# Patient Record
Sex: Male | Born: 1960 | Race: Black or African American | Hispanic: No | Marital: Married | Smoking: Never smoker
Health system: Southern US, Community
[De-identification: ages and names within clinical notes are randomized; demographics above are authoritative.]

## PROBLEM LIST (undated history)

## (undated) DIAGNOSIS — I10 Essential (primary) hypertension: Secondary | ICD-10-CM

## (undated) DIAGNOSIS — N289 Disorder of kidney and ureter, unspecified: Secondary | ICD-10-CM

## (undated) DIAGNOSIS — N200 Calculus of kidney: Secondary | ICD-10-CM

## (undated) DIAGNOSIS — J45909 Unspecified asthma, uncomplicated: Secondary | ICD-10-CM

## (undated) DIAGNOSIS — E7800 Pure hypercholesterolemia, unspecified: Secondary | ICD-10-CM

## (undated) DIAGNOSIS — T7840XA Allergy, unspecified, initial encounter: Secondary | ICD-10-CM

## (undated) HISTORY — PX: SHOULDER ARTHROSCOPY WITH ROTATOR CUFF REPAIR: SHX5685

## (undated) HISTORY — PX: VASECTOMY: SHX75

## (undated) HISTORY — PX: KNEE ARTHROSCOPY: SUR90

---

## 2000-07-01 HISTORY — PX: VASECTOMY: SHX75

## 2005-07-25 ENCOUNTER — Ambulatory Visit (HOSPITAL_BASED_OUTPATIENT_CLINIC_OR_DEPARTMENT_OTHER): Admission: RE | Admit: 2005-07-25 | Discharge: 2005-07-25 | Payer: Self-pay | Admitting: Orthopedic Surgery

## 2013-01-23 ENCOUNTER — Emergency Department (HOSPITAL_BASED_OUTPATIENT_CLINIC_OR_DEPARTMENT_OTHER): Payer: BC Managed Care – PPO

## 2013-01-23 ENCOUNTER — Emergency Department (HOSPITAL_BASED_OUTPATIENT_CLINIC_OR_DEPARTMENT_OTHER)
Admission: EM | Admit: 2013-01-23 | Discharge: 2013-01-23 | Disposition: A | Discharge status: Home / Self Care | Payer: BC Managed Care – PPO | Attending: Emergency Medicine | Admitting: Emergency Medicine

## 2013-01-23 ENCOUNTER — Encounter (HOSPITAL_BASED_OUTPATIENT_CLINIC_OR_DEPARTMENT_OTHER): Payer: Self-pay | Admitting: *Deleted

## 2013-01-23 DIAGNOSIS — N200 Calculus of kidney: Secondary | ICD-10-CM

## 2013-01-23 DIAGNOSIS — R110 Nausea: Secondary | ICD-10-CM | POA: Insufficient documentation

## 2013-01-23 HISTORY — DX: Disorder of kidney and ureter, unspecified: N28.9

## 2013-01-23 LAB — URINALYSIS, ROUTINE W REFLEX MICROSCOPIC
Leukocytes, UA: NEGATIVE
Protein, ur: NEGATIVE mg/dL
Specific Gravity, Urine: 1.019 (ref 1.005–1.030)
Urobilinogen, UA: 1 mg/dL (ref 0.0–1.0)

## 2013-01-23 LAB — URINE MICROSCOPIC-ADD ON

## 2013-01-23 MED ORDER — OXYCODONE-ACETAMINOPHEN 5-325 MG PO TABS: 2.0000 | ORAL_TABLET | ORAL | Status: DC | PRN

## 2013-01-23 MED ORDER — SODIUM CHLORIDE 0.9 % IV BOLUS (SEPSIS)
1000.0000 mL | Freq: Once | INTRAVENOUS | Status: AC
  Administered 2013-01-23: 1000 mL via INTRAVENOUS

## 2013-01-23 MED ORDER — KETOROLAC TROMETHAMINE 30 MG/ML IJ SOLN
30.0000 mg | Freq: Once | INTRAMUSCULAR | Status: AC
  Administered 2013-01-23: 30 mg via INTRAVENOUS
  Filled 2013-01-23: qty 1

## 2013-01-23 MED ORDER — ONDANSETRON HCL 4 MG/2ML IJ SOLN
4.0000 mg | Freq: Once | INTRAMUSCULAR | Status: AC
  Administered 2013-01-23: 4 mg via INTRAVENOUS
  Filled 2013-01-23: qty 2

## 2013-01-23 NOTE — ED Provider Notes (Signed)
  CSN: [id number]     Arrival date & time 01/23/13  0158 History     First MD Initiated Contact with Patient 01/23/13 0209     Chief Complaint  Patient presents with  . Flank Pain   (Consider location/radiation/quality/duration/timing/severity/associated sxs/prior Treatment) HPI Comments: Patient started about 2 hours ago with the sudden onset of pain in the right flank radiating to the right lower quadrant.  He is having nausea, but denies urinary complaints.  No fevers or chills.  He reports having had a kidney stone 20 years ago but no problems since.    Patient is a 52 y.o. male presenting with flank pain. The history is provided by the patient.  Flank Pain This is a new problem. The current episode started 1 to 2 hours ago. The problem occurs constantly. The problem has not changed since onset.Associated symptoms include abdominal pain. Nothing aggravates the symptoms. Nothing relieves the symptoms. He has tried nothing for the symptoms. The treatment provided no relief.    History reviewed. No pertinent past medical history. History reviewed. No pertinent past surgical history. History reviewed. No pertinent family history. History  Substance Use Topics  . Smoking status: Never Smoker   . Smokeless tobacco: Never Used  . Alcohol Use: No    Review of Systems  Gastrointestinal: Positive for abdominal pain.  Genitourinary: Positive for flank pain.  All other systems reviewed and are negative.    Allergies  Iodine  Home Medications  No current outpatient prescriptions on file. BP 140/79  Pulse 59  Temp(Src) 98.1 F (36.7 C) (Oral)  Resp 18  Ht 5\' 11"  (1.803 m)  Wt 200 lb (90.719 kg)  BMI 27.91 kg/m2  SpO2 98% Physical Exam  Nursing note and vitals reviewed. Constitutional: He is oriented to person, place, and time. He appears well-developed and well-nourished. No distress.  HENT:  Head: Normocephalic and atraumatic.  Mouth/Throat: Oropharynx is clear and moist.   Neck: Normal range of motion. Neck supple.  Cardiovascular: Normal rate, regular rhythm and normal heart sounds.   Pulmonary/Chest: Effort normal and breath sounds normal.  Abdominal: Soft. Bowel sounds are normal. He exhibits no distension. There is no tenderness.  There is mild ttp in the right flank and right lower quadrant.  No rebound or guarding.  Musculoskeletal: Normal range of motion. He exhibits no edema.  Lymphadenopathy:    He has no cervical adenopathy.  Neurological: He is alert and oriented to person, place, and time.  Skin: Skin is warm and dry. He is not diaphoretic.    ED Course   Procedures (including critical care time)  Labs Reviewed - No data to display No results found. No diagnosis found.  MDM  The ct shows a 3 mm mid ureteral stone.  He is feeling better with meds given.  Will discharge with pain meds, follow up with urology if not improving in the next 2-3 days.  Return prn.  Geoffery Lyons, MD 01/23/13 [phone]

## 2013-01-23 NOTE — ED Notes (Signed)
Pt states that he began having right flank pain this am around 12:30 as well as nausea

## 2013-03-03 ENCOUNTER — Ambulatory Visit: Payer: BC Managed Care – PPO | Attending: Orthopedic Surgery | Admitting: Rehabilitation

## 2013-03-03 DIAGNOSIS — M25569 Pain in unspecified knee: Secondary | ICD-10-CM | POA: Insufficient documentation

## 2013-03-03 DIAGNOSIS — M25669 Stiffness of unspecified knee, not elsewhere classified: Secondary | ICD-10-CM | POA: Insufficient documentation

## 2013-03-03 DIAGNOSIS — IMO0001 Reserved for inherently not codable concepts without codable children: Secondary | ICD-10-CM | POA: Insufficient documentation

## 2013-03-03 DIAGNOSIS — R609 Edema, unspecified: Secondary | ICD-10-CM | POA: Insufficient documentation

## 2013-03-08 ENCOUNTER — Ambulatory Visit: Payer: BC Managed Care – PPO | Admitting: Rehabilitation

## 2013-03-11 ENCOUNTER — Ambulatory Visit: Payer: BC Managed Care – PPO | Admitting: Rehabilitation

## 2013-03-15 ENCOUNTER — Ambulatory Visit: Payer: BC Managed Care – PPO | Admitting: Rehabilitation

## 2013-03-17 ENCOUNTER — Ambulatory Visit: Payer: BC Managed Care – PPO | Admitting: Rehabilitation

## 2013-03-22 ENCOUNTER — Ambulatory Visit: Payer: BC Managed Care – PPO | Admitting: Rehabilitation

## 2013-03-25 ENCOUNTER — Ambulatory Visit: Payer: BC Managed Care – PPO | Admitting: Rehabilitation

## 2013-03-29 ENCOUNTER — Ambulatory Visit: Payer: BC Managed Care – PPO | Admitting: Rehabilitation

## 2013-04-01 ENCOUNTER — Ambulatory Visit: Payer: BC Managed Care – PPO | Attending: Orthopedic Surgery | Admitting: Rehabilitation

## 2013-04-01 DIAGNOSIS — M25669 Stiffness of unspecified knee, not elsewhere classified: Secondary | ICD-10-CM | POA: Insufficient documentation

## 2013-04-01 DIAGNOSIS — R609 Edema, unspecified: Secondary | ICD-10-CM | POA: Insufficient documentation

## 2013-04-01 DIAGNOSIS — M25569 Pain in unspecified knee: Secondary | ICD-10-CM | POA: Insufficient documentation

## 2013-04-01 DIAGNOSIS — IMO0001 Reserved for inherently not codable concepts without codable children: Secondary | ICD-10-CM | POA: Insufficient documentation

## 2013-04-05 ENCOUNTER — Ambulatory Visit: Payer: BC Managed Care – PPO | Admitting: Rehabilitation

## 2013-04-08 ENCOUNTER — Ambulatory Visit: Payer: BC Managed Care – PPO | Admitting: Rehabilitation

## 2013-04-12 ENCOUNTER — Ambulatory Visit: Payer: BC Managed Care – PPO | Admitting: Rehabilitation

## 2013-04-15 ENCOUNTER — Ambulatory Visit: Payer: BC Managed Care – PPO | Admitting: Rehabilitation

## 2013-04-19 ENCOUNTER — Ambulatory Visit: Payer: BC Managed Care – PPO | Admitting: Rehabilitation

## 2013-04-22 ENCOUNTER — Ambulatory Visit: Payer: BC Managed Care – PPO | Admitting: Rehabilitation

## 2013-04-26 ENCOUNTER — Ambulatory Visit: Payer: BC Managed Care – PPO | Admitting: Rehabilitation

## 2013-04-29 ENCOUNTER — Ambulatory Visit: Payer: BC Managed Care – PPO | Admitting: Rehabilitation

## 2013-05-06 ENCOUNTER — Encounter: Payer: BC Managed Care – PPO | Admitting: Rehabilitation

## 2013-09-03 ENCOUNTER — Encounter (HOSPITAL_COMMUNITY): Payer: Self-pay | Admitting: *Deleted

## 2013-09-03 ENCOUNTER — Emergency Department (HOSPITAL_BASED_OUTPATIENT_CLINIC_OR_DEPARTMENT_OTHER): Payer: BC Managed Care – PPO

## 2013-09-03 ENCOUNTER — Encounter (HOSPITAL_BASED_OUTPATIENT_CLINIC_OR_DEPARTMENT_OTHER): Payer: Self-pay | Admitting: Emergency Medicine

## 2013-09-03 ENCOUNTER — Other Ambulatory Visit: Payer: Self-pay | Admitting: Urology

## 2013-09-03 ENCOUNTER — Emergency Department (HOSPITAL_BASED_OUTPATIENT_CLINIC_OR_DEPARTMENT_OTHER)
Admission: EM | Admit: 2013-09-03 | Discharge: 2013-09-03 | Disposition: A | Discharge status: Home / Self Care | Payer: BC Managed Care – PPO | Attending: Emergency Medicine | Admitting: Emergency Medicine

## 2013-09-03 DIAGNOSIS — Z79899 Other long term (current) drug therapy: Secondary | ICD-10-CM | POA: Insufficient documentation

## 2013-09-03 DIAGNOSIS — N200 Calculus of kidney: Secondary | ICD-10-CM | POA: Insufficient documentation

## 2013-09-03 HISTORY — DX: Calculus of kidney: N20.0

## 2013-09-03 LAB — CBC WITH DIFFERENTIAL/PLATELET
Basophils Absolute: 0 10*3/uL (ref 0.0–0.1)
Basophils Relative: 0 % (ref 0–1)
Eosinophils Absolute: 0.1 10*3/uL (ref 0.0–0.7)
Eosinophils Relative: 1 % (ref 0–5)
HCT: 40.4 % (ref 39.0–52.0)
HEMOGLOBIN: 14.1 g/dL (ref 13.0–17.0)
LYMPHS ABS: 1 10*3/uL (ref 0.7–4.0)
Lymphocytes Relative: 13 % (ref 12–46)
MCH: 30.5 pg (ref 26.0–34.0)
MCHC: 34.9 g/dL (ref 30.0–36.0)
MCV: 87.3 fL (ref 78.0–100.0)
MONOS PCT: 7 % (ref 3–12)
Monocytes Absolute: 0.5 10*3/uL (ref 0.1–1.0)
NEUTROS ABS: 6.5 10*3/uL (ref 1.7–7.7)
NEUTROS PCT: 80 % — AB (ref 43–77)
Platelets: 206 10*3/uL (ref 150–400)
RBC: 4.63 MIL/uL (ref 4.22–5.81)
RDW: 13 % (ref 11.5–15.5)
WBC: 8.1 10*3/uL (ref 4.0–10.5)

## 2013-09-03 LAB — BASIC METABOLIC PANEL
BUN: 17 mg/dL (ref 6–23)
CHLORIDE: 103 meq/L (ref 96–112)
CO2: 26 mEq/L (ref 19–32)
Calcium: 9.8 mg/dL (ref 8.4–10.5)
Creatinine, Ser: 1.4 mg/dL — ABNORMAL HIGH (ref 0.50–1.35)
GFR calc non Af Amer: 57 mL/min — ABNORMAL LOW (ref >90)
GFR, EST AFRICAN AMERICAN: 66 mL/min — AB (ref >90)
GLUCOSE: 139 mg/dL — AB (ref 70–99)
POTASSIUM: 4.1 meq/L (ref 3.7–5.3)
Sodium: 140 mEq/L (ref 137–147)

## 2013-09-03 LAB — URINALYSIS, ROUTINE W REFLEX MICROSCOPIC
Bilirubin Urine: NEGATIVE
Glucose, UA: NEGATIVE mg/dL
Hgb urine dipstick: NEGATIVE
Ketones, ur: NEGATIVE mg/dL
LEUKOCYTES UA: NEGATIVE
NITRITE: NEGATIVE
PH: 7.5 (ref 5.0–8.0)
Protein, ur: NEGATIVE mg/dL
SPECIFIC GRAVITY, URINE: 1.012 (ref 1.005–1.030)
Urobilinogen, UA: 0.2 mg/dL (ref 0.0–1.0)

## 2013-09-03 MED ORDER — MORPHINE SULFATE 4 MG/ML IJ SOLN
INTRAMUSCULAR | Status: AC
  Filled 2013-09-03: qty 1

## 2013-09-03 MED ORDER — MORPHINE SULFATE 4 MG/ML IJ SOLN
4.0000 mg | Freq: Once | INTRAMUSCULAR | Status: AC
  Administered 2013-09-03: 4 mg via INTRAVENOUS

## 2013-09-03 MED ORDER — OXYCODONE-ACETAMINOPHEN 5-325 MG PO TABS
1.0000 | ORAL_TABLET | Freq: Four times a day (QID) | ORAL | Status: DC | PRN
Start: 2013-09-03 — End: 2022-02-04

## 2013-09-03 MED ORDER — TAMSULOSIN HCL 0.4 MG PO CAPS: 0.4000 mg | ORAL_CAPSULE | Freq: Every day | ORAL | Status: DC

## 2013-09-03 MED ORDER — TAMSULOSIN HCL 0.4 MG PO CAPS
0.4000 mg | ORAL_CAPSULE | Freq: Once | ORAL | Status: AC
  Administered 2013-09-03: 0.4 mg via ORAL
  Filled 2013-09-03: qty 1

## 2013-09-03 MED ORDER — OXYCODONE-ACETAMINOPHEN 5-325 MG PO TABS
1.0000 | ORAL_TABLET | Freq: Once | ORAL | Status: AC
  Administered 2013-09-03: 1 via ORAL
  Filled 2013-09-03: qty 1

## 2013-09-03 MED ORDER — KETOROLAC TROMETHAMINE 15 MG/ML IJ SOLN
INTRAMUSCULAR | Status: AC
  Administered 2013-09-03: 30 mg
  Filled 2013-09-03: qty 2

## 2013-09-03 MED ORDER — ONDANSETRON 8 MG PO TBDP: ORAL_TABLET | ORAL | Status: DC

## 2013-09-03 MED ORDER — KETOROLAC TROMETHAMINE 30 MG/ML IJ SOLN: 30.0000 mg | Freq: Once | INTRAMUSCULAR | Status: DC

## 2013-09-03 NOTE — ED Notes (Signed)
Pt reports left sided flank pain with acute onset, denies difficulty voiding

## 2013-09-03 NOTE — ED Provider Notes (Signed)
CSN: [id number]     Arrival date & time 09/03/13  0046 History   First MD Initiated Contact with Patient 09/03/13 0056     Chief Complaint  Patient presents with  . Flank Pain     (Consider location/radiation/quality/duration/timing/severity/associated sxs/prior Treatment) Patient is a 53 y.o. male presenting with flank pain. The history is provided by the patient.  Flank Pain This is a new problem. The current episode started 6 to 12 hours ago. The problem occurs constantly. The problem has not changed since onset.Pertinent negatives include no chest pain, no abdominal pain, no headaches and no shortness of breath. Nothing aggravates the symptoms. Nothing relieves the symptoms. He has tried acetaminophen for the symptoms. The treatment provided mild relief.    Past Medical History  Diagnosis Date  . Renal disorder     kidney stone  . Kidney stone    Past Surgical History  Procedure Laterality Date  . Vasectomy    . Knee arthroscopy     History reviewed. No pertinent family history. History  Substance Use Topics  . Smoking status: Never Smoker   . Smokeless tobacco: Never Used  . Alcohol Use: No    Review of Systems  Respiratory: Negative for shortness of breath.   Cardiovascular: Negative for chest pain.  Gastrointestinal: Negative for vomiting, abdominal pain, diarrhea and constipation.  Genitourinary: Positive for flank pain. Negative for dysuria, hematuria and difficulty urinating.  Neurological: Negative for headaches.  All other systems reviewed and are negative.      Allergies  Iodine and Shellfish allergy  Home Medications   Current Outpatient Rx  Name  Route  Sig  Dispense  Refill  . allopurinol (ZYLOPRIM) 100 MG tablet   Oral   Take 100 mg by mouth daily.         . NON FORMULARY      azor         . olmesartan (BENICAR) 40 MG tablet   Oral   Take 40 mg by mouth daily.         Marland Kitchen oxyCODONE-acetaminophen (PERCOCET) 5-325 MG per tablet  Oral   Take 2 tablets by mouth every 4 (four) hours as needed for pain.   25 tablet   0   . UNKNOWN TO PATIENT      cholesterol          BP 155/93  Pulse 80  Temp(Src) 98.7 F (37.1 C) (Oral)  Resp 18  Ht 5\' 11"  (1.803 m)  Wt 205 lb (92.987 kg)  BMI 28.60 kg/m2  SpO2 97% Physical Exam  Constitutional: He is oriented to person, place, and time. He appears well-nourished. No distress.  HENT:  Head: Normocephalic and atraumatic.  Mouth/Throat: Oropharynx is clear and moist.  Eyes: Conjunctivae are normal. Pupils are equal, round, and reactive to light.  Neck: Normal range of motion. Neck supple.  Cardiovascular: Normal rate, regular rhythm and intact distal pulses.   Pulmonary/Chest: Effort normal and breath sounds normal. He has no wheezes. He has no rales.  Abdominal: Soft. Bowel sounds are normal. There is no tenderness. There is no rebound and no guarding.  Musculoskeletal: Normal range of motion.  Neurological: He is alert and oriented to person, place, and time.  Skin: Skin is warm and dry.  Psychiatric: He has a normal mood and affect.    ED Course  Procedures (including critical care time) Labs Review Labs Reviewed  CBC WITH DIFFERENTIAL - Abnormal; Notable for the following:    Neutrophils  Relative % 80 (*)    All other components within normal limits  BASIC METABOLIC PANEL - Abnormal; Notable for the following:    Glucose, Bld 139 (*)    Creatinine, Ser 1.40 (*)    GFR calc non Af Amer 57 (*)    GFR calc Af Amer 66 (*)    All other components within normal limits  URINALYSIS, ROUTINE W REFLEX MICROSCOPIC  URINALYSIS, ROUTINE W REFLEX MICROSCOPIC   Imaging Review No results found.   EKG Interpretation None      MDM   Final diagnoses:  None    Kidney stone, pain medication, antiemetics and follow up with urology for ongoing care   Virga Haltiwanger K Yicel Shannon-Rasch, MD 09/03/13 [phone]

## 2013-09-03 NOTE — ED Notes (Signed)
Left flank pain onset Thursday am, off and on  Worse last pm, denies n/v  No diff w urination

## 2013-09-03 NOTE — Discharge Instructions (Signed)
Diet for Kidney Stones  Kidney stones are small, hard masses that form inside your kidneys. They are made up of salts and minerals and often form when high levels build up in the urine. The minerals can then start to build up, crystalize, and stick together to form stones. There are several different types of kidney stones. The following types of stones may be influenced by dietary factors:   · Calcium Oxalate Stones. An oxalate is a salt found in certain foods. Within the body, calcium can combine with oxalates to form calcium oxalate stones, which can be excreted in the urine in high amounts. This is the most common type of kidney stone.  · Calcium Phosphate Stones. These stones may occur when the pH of the urine becomes too high, or less acidic, from too much calcium being excreted in the urine. The pH is a measure of how acidic or basic a substance is.  · Uric Acid Stones. This type of stone occurs when the pH of the urine becomes too low, or very acidic, because substances called purines build up in the urine. Purines are found in animal proteins. When the urine is highly concentrated with acid, uric acid kidney stones can form.    Other risk factors for kidney stones include genetics, environment, and being overweight. Your caregiver may ask you to follow specific diet guidelines based on the type of stone you have to lessen the chances of your body making more kidney stones.   GENERAL GUIDELINES FOR ALL TYPES OF STONES  · Drink plenty of fluid. Drink 12 16 cups of fluid a day, drinking mainly water. This is the most important thing you can do to prevent the formation of future kidney stones.  · Maintain a healthy weight. Your caregiver or dietitian can help you determine what a healthy weight is for you. If you are overweight, weight loss may help prevent the formation of future kidney stones.  · Eat a diet adequate in animal protein. Too much animal protein can contribute to the formation of stones. Your  dietitian can help you determine how much protein you should be eating. Avoid low carbohydrate, high protein diets.  · Follow a balanced eating approach. The DASH diet, which stands for "Dietary Approaches to Stop Hypertension," is an effective meal plan for reducing stone formation. This diet is high in fruits, vegetables, dairy, and whole grains and low in animal protein. Ask your caregiver or dietitian for information about the DASH diet.  ADDITIONAL DIET GUIDELINES FOR CALCIUM STONES  Avoid foods high in salt. This includes table salt, salt seasonings, MSG, soy sauce, cured and processed meats, salted crackers and snack foods, fast food, and canned soups and foods. Ask your caregiver or dietitian for information about reducing sodium in your diet or following the low sodium diet.   Ensure adequate calcium intake. Use the following table for calcium guidelines:  · Men 65 years old and younger  1000 mg/day.  · Men 65 years old and older  1500 mg/day.  · Women 25 50 years old  1000 mg/day.  · Women 50 years and older  1500 mg/day.  Your dietitian can help you determine if you are getting enough calcium in your diet. Foods that are high in calcium include dairy products, broccoli, cheese, yogurt, and pudding. If you need to take a calcium supplement, take it only in the form of calcium citrate.   Avoid foods high in oxalate. Be sure that any supplements you take do not   contain more than 500 mg of vitamin C. Vitamin C is converted into oxalate in the body. You do not need to avoid fruits and vegetables high in vitamin C.   · Grains: High-fiber or bran cereal, whole-wheat bread, grits, barley, buckwheat, amaranth, pretzels, and fruitcake.  · Vegetables: Dried beans, wax beans, dark leafy greens, eggplant, leeks, okra, parsley, rutabaga, tomato paste, watercress, zucchini, and escarole.  · Fruit: Dried apricots, red currants, figs, kiwi, and rhubarb.  · Meat and Meat Substitutes: Soybeans and foods made from soy  (soyburger, miso), dried beans, peanut butter.  · Milk: Chocolate milk mixes and soymilk.  · Fats and Oils: Nuts (peanuts, almonds, pecans, cashews, hazelnuts) and nut butters, sesame seeds, and tDahini paste.  · Condiments/Miscellaneous: Chocolate, carob, marmalade, poppy seeds, instant iced tea, and juice from high-oxalate fruits.     Document Released: 10/12/2010 Document Revised: 12/17/2011 Document Reviewed: 12/02/2011  ExitCare® Patient Information ©2014 [employer].

## 2013-09-05 NOTE — H&P (Signed)
History of Present Illness          F/u several issues. His PCP is Dr. Carolyne Fiscal      1-impotence - He sees a urologist in [facility] for impotence. He had T checked and it was "low normal". He tried Cialis on demand.   His is a [employer] carrier.   -Oct 2014 T 336    2- BPH - normal DRE 01-25-2013. On surveillance.   -Oct 2014 PSA 0.6    3-nephrolithiasis -   -07-28-14right flank pain - CT A/P revealed a 3 mm right prox ureteral stone, a 2 mm right renal stone and a 4mm left renal stone, enlarged prostate. He has passed stones  -07-28-14KUB -- calcification in the right pelvis consistent with a stone. The right kidney is difficult to visualize because of the overlying bowel. The left kidney shows a 4 mm left lower pole stone. The bowel gas pattern and bones appear normal.   Tried tamsulosin  -Sept 2014 passed stone -> CaOX        Mar 2015 interval hx    Patient returns due to a left ureteral stone. He developed acute onset left flank pain yesterday which was severe by the evening. He went to the ER where a CT scan of the abdomen and pelvis was obtained. This showed a 5-6 mm left proximal ureteral stone with mild hydronephrosis. There were no other stones in the left kidney. One small stone remains in the right kidney. The stone was visible on the scout. I reviewed all the images. His urine was clear. White count 8, BUN 17, creatinine 1.4    Today his pain is controlled. He has been staying hydrated. He has not passed the stone.     Past Medical History Problems  1. History of hypercholesterolemia (V12.29) 2. History of hypertension (V12.59) 3. History of kidney stones (V13.01)  Surgical History Problems  1. History of Knee Arthroscopy  Current Meds 1. Allopurinol TABS;  Therapy: (Recorded:28Jul2014) to Recorded 2. Atorvastatin Calcium TABS;  Therapy: (Recorded:28Jul2014) to Recorded 3. Azor 5-20 MG Oral Tablet;  Therapy: 26Jan2015 to Recorded 4.  Ondansetron 4 MG Oral Tablet Dispersible;  Therapy: (Recorded:06Mar2015) to Recorded 5. Oxycodone-Acetaminophen 5-325 MG Oral Tablet;  Therapy: 27Aug2014 to Recorded 6. Tamsulosin HCl - 0.4 MG Oral Capsule;  Therapy: (Recorded:06Mar2015) to Recorded  Allergies Medication  1. Iodine SOLN Non-Medication  2. Contrast Dye 3. Shellfish  Family History Problems  1. Family history of Acute Myocardial Infarction (V17.3) : Father 2. Family history of Death In The Family Father   MI age 10 3. Family history of Death In The Family Mother   brain aneurysm 4. Family history of Family Health Status Number Of Children   1 son    1 daughter 5. Family history of Lung Cancer (V16.1)  Social History Problems  1. Alcohol Use   rarely 2. Caffeine Use   maybe 1 a week 3. Marital History - Currently Married 4. Never A Smoker 5. Occupation:   [employer] 6. Denied: History of Tobacco Use (305.1)  Vitals Vital Signs [Data Includes: Last 1 Day]  Recorded: 06Mar2015 02:30PM  Blood Pressure: 132 / 75 Temperature: 97.1 F Heart Rate: 83  Physical Exam Constitutional: Well nourished and well developed . No acute distress.  Abdomen: No CVA tenderness.  Neuro/Psych:. Mood and affect are appropriate.    Results/Data Urine [Data Includes: Last 1 Day]   06Mar2015  COLOR YELLOW   APPEARANCE  CLEAR   SPECIFIC GRAVITY >1.030   pH 5.5   GLUCOSE NEG mg/dL  BILIRUBIN NEG   KETONE NEG mg/dL  BLOOD TRACE   PROTEIN NEG mg/dL  UROBILINOGEN 0.2 mg/dL  NITRITE NEG   LEUKOCYTE ESTERASE NEG   SQUAMOUS EPITHELIAL/HPF RARE   WBC 0-2 WBC/hpf  RBC 0-2 RBC/hpf  BACTERIA RARE   CRYSTALS NONE SEEN   CASTS NONE SEEN   Other MUCUS NOTED    Procedure KUB-comparison to CT from last night, findings: stone in left proximal ureter. No other stones. Normal bone and bowel gas pattern.     Assessment Assessed  1. Calculus of ureter (592.1)  Plan  Calculus of ureter  1. Follow-up Schedule Surgery  Office  Follow-up  Status: Complete  Done: 06Mar2015 Health Maintenance  2. UA With REFLEX; [Do Not Release]; Status:Complete;   Done: 06Mar2015 02:16PM  KUB; Status:Resulted - Requires Verification;  Done: 01Jan0001 12:00AM Due:08Mar2015; Marked Important;Ordered; Today;  ONG:[id number]:Calculus of ureter, Health Maintenance; Ordered KG:MWNUUVOZBy:Sona Nations;   Discussion/Summary I reviewed the CT and KUB images with the patient, we discussed the nature risk benefits of continued stone passage with off label use of tamsulosin, shockwave lithotripsy, ureteroscopy. We discussed shockwave lithotripsy is less invasive than ureteroscopy and typically with less side effects, but lower success rate. We discussed surveillance with a repeat KUB in 1-3 weeks. All questions answered and he elects to proceed with shockwave lithotripsy.     Signatures Electronically signed by : Jerilee FieldMatthew Trenten Watchman, M.D.; Sep 03 2013  4:42PM EST

## 2013-09-06 ENCOUNTER — Encounter (HOSPITAL_COMMUNITY)
Admission: RE | Disposition: A | Discharge status: Home / Self Care | Payer: Self-pay | Source: Ambulatory Visit | Attending: Urology

## 2013-09-06 ENCOUNTER — Ambulatory Visit (HOSPITAL_COMMUNITY): Payer: BC Managed Care – PPO

## 2013-09-06 ENCOUNTER — Ambulatory Visit (HOSPITAL_COMMUNITY)
Admission: RE | Admit: 2013-09-06 | Discharge: 2013-09-06 | Disposition: A | Discharge status: Home / Self Care | Payer: BC Managed Care – PPO | Source: Ambulatory Visit | Attending: Urology | Admitting: Urology

## 2013-09-06 ENCOUNTER — Encounter (HOSPITAL_COMMUNITY): Payer: Self-pay | Admitting: General Practice

## 2013-09-06 DIAGNOSIS — N201 Calculus of ureter: Secondary | ICD-10-CM | POA: Insufficient documentation

## 2013-09-06 DIAGNOSIS — N529 Male erectile dysfunction, unspecified: Secondary | ICD-10-CM | POA: Insufficient documentation

## 2013-09-06 DIAGNOSIS — N400 Enlarged prostate without lower urinary tract symptoms: Secondary | ICD-10-CM | POA: Insufficient documentation

## 2013-09-06 DIAGNOSIS — I10 Essential (primary) hypertension: Secondary | ICD-10-CM | POA: Insufficient documentation

## 2013-09-06 DIAGNOSIS — E7800 Pure hypercholesterolemia, unspecified: Secondary | ICD-10-CM | POA: Insufficient documentation

## 2013-09-06 DIAGNOSIS — Z79899 Other long term (current) drug therapy: Secondary | ICD-10-CM | POA: Insufficient documentation

## 2013-09-06 SURGERY — LITHOTRIPSY, ESWL
Anesthesia: LOCAL

## 2013-09-06 MED ORDER — CIPROFLOXACIN HCL 500 MG PO TABS
500.0000 mg | ORAL_TABLET | ORAL | Status: AC
  Administered 2013-09-06: 500 mg via ORAL
  Filled 2013-09-06: qty 1

## 2013-09-06 MED ORDER — DIAZEPAM 5 MG PO TABS
10.0000 mg | ORAL_TABLET | ORAL | Status: AC
  Administered 2013-09-06: 10 mg via ORAL
  Filled 2013-09-06: qty 2

## 2013-09-06 MED ORDER — SODIUM CHLORIDE 0.9 % IV SOLN
INTRAVENOUS | Status: DC
  Administered 2013-09-06: 13:00:00 via INTRAVENOUS

## 2013-09-06 MED ORDER — DIPHENHYDRAMINE HCL 25 MG PO CAPS
25.0000 mg | ORAL_CAPSULE | ORAL | Status: AC
  Administered 2013-09-06: 25 mg via ORAL
  Filled 2013-09-06: qty 1

## 2013-09-06 NOTE — Discharge Instructions (Signed)
Ureteral Colic Ureteral colic is spasm-like pain from the kidney or the ureter. This is often caused by a kidney stone. The pain is caused by the stone trying to get through the tubes that pass your pee. HOME CARE   Drink enough fluids to keep your pee (urine) clear or pale yellow.  Strain all your pee. A strainer will be provided. Keep anything caught in the strainer and bring it to your doctor. The stone causing the pain may be very small.  Only take medicine as told by your doctor.  Follow up with your doctor as told. GET HELP RIGHT AWAY IF:   Pain is not controlled with medicine.  Pain continues or gets worse.  The pain changes and there is chest or belly (abdominal) pain.  You pass out (faint).  You cannot pee.  You keep throwing up (vomiting).  You have a temperature by mouth above 102 F (38.9 C), not controlled by medicine. MAKE SURE YOU:   Understand these instructions.  Will watch this condition.  Will get help right away if you are not doing well or get worse. Document Released: 12/04/2007 Document Revised: 09/09/2011 Document Reviewed: 12/04/2007 ExitCare Patient Information 2014 [employer].  

## 2013-09-06 NOTE — Op Note (Signed)
See [facility] op note -- attempted ESWL. No ESWL done. Could not localize stone, pt allergic to IV dye. Discussed options again with pt - URS or f/u. He'll f/u in office as planned. Cont tamsulosin.

## 2013-09-06 NOTE — Interval H&P Note (Signed)
History and Physical Interval Note:  09/06/2013 2:54 PM  Brandon Martinez  has presented today for surgery, with the diagnosis of LEFT URETERAL STONE  The various methods of treatment have been discussed with the patient and family. After consideration of risks, benefits and other options for treatment, the patient has consented to  Procedure(s): EXTRACORPOREAL SHOCK WAVE LITHOTRIPSY (ESWL) (N/A) as a surgical intervention .  The patient's history has been reviewed, patient examined, no change in status, stable for surgery.  I have reviewed the patient's chart and labs.  Questions were answered to the patient's satisfaction.  Patient has not passed a stone and continues to have some left UQ and flank pain. KUB with no obvious stone. Lots of bowel gas. Discussed findings with patient and nature, r/b of f/u in office, schedule URS or proceed with attempted ESWL. He wants to proceed with attempted ESWL.    Antony HasteEskridge, Haelyn Forgey Ramsey

## 2013-09-06 NOTE — Progress Notes (Signed)
[facility] [employer] [facility] without procedure being done. Unable to visualize stone. Pt up to BR with minimal assist to void

## 2014-08-24 IMAGING — CT CT ABD-PELV W/O CM
2 of 4 series · 17 of 46 positions shown, 19 images · non-contrast
Comparison: None.

CLINICAL DATA: Right flank pain.  Possible kidney stone.  Nausea.

CT ABDOMEN AND PELVIS WITHOUT CONTRAST
TECHNIQUE: Multidetector CT imaging of the abdomen and pelvis was
performed following the standard protocol without intravenous
contrast.

[Series 2: renal stone < 200 lbs 5.0 b31f · axial · 0.78mm/px · z∈[-432,-2]mm · 14 of 94 slices shown, 16 images]
[im 4/94  soft-tissue]
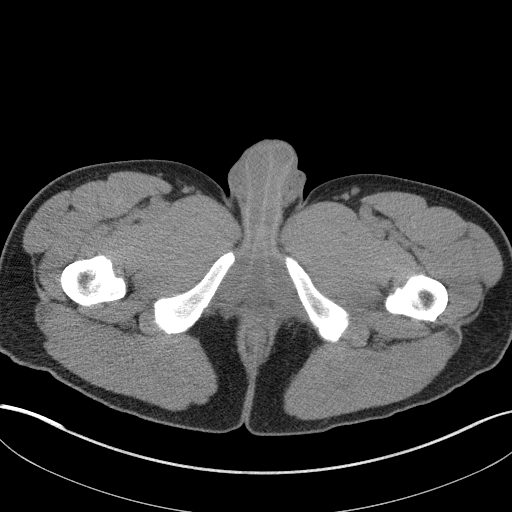
[im 4/94  bone]
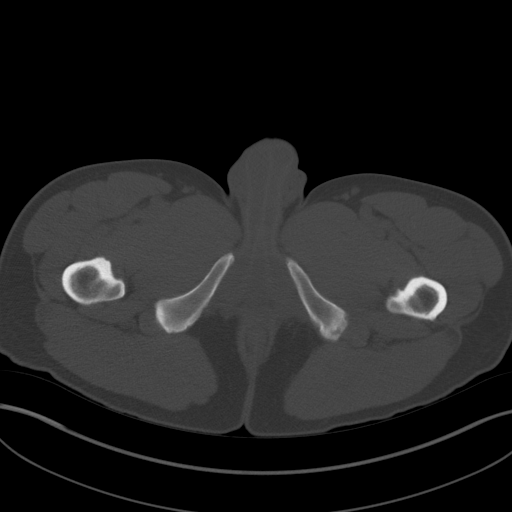
[im 12/94  soft-tissue]
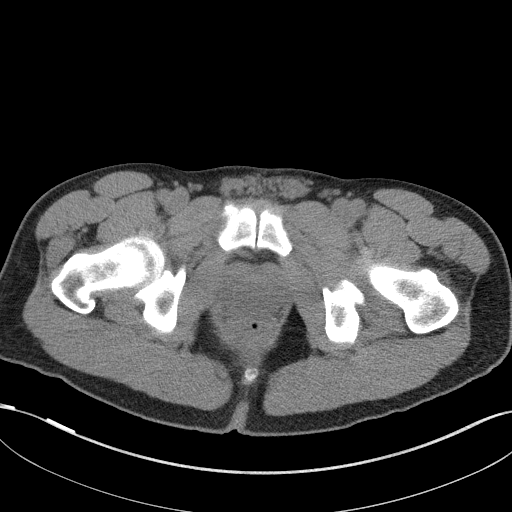
[im 19/94  soft-tissue]
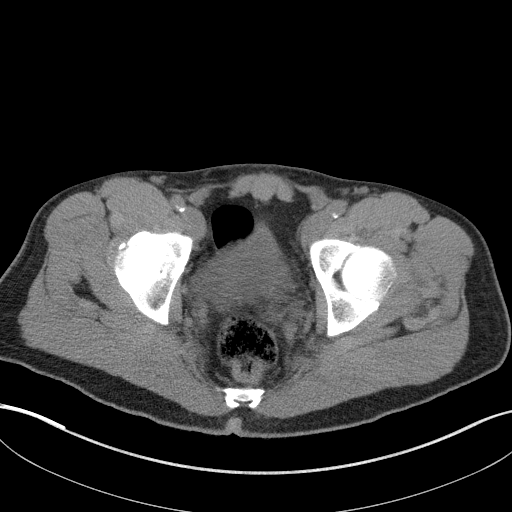
[im 27/94  soft-tissue]
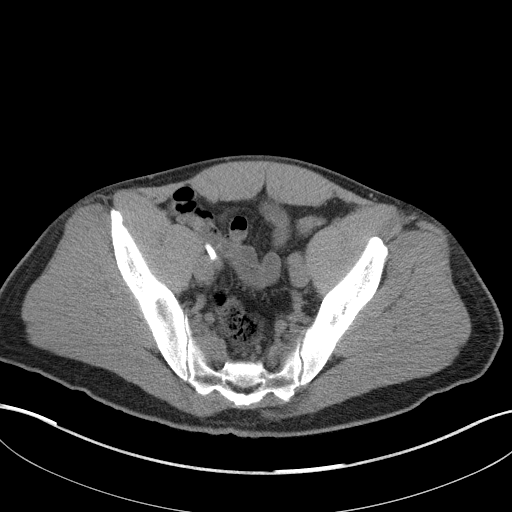
[im 30/94  soft-tissue]
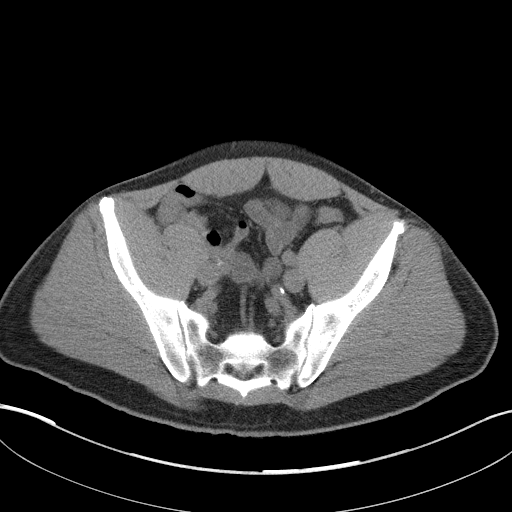
[im 38/94  soft-tissue]
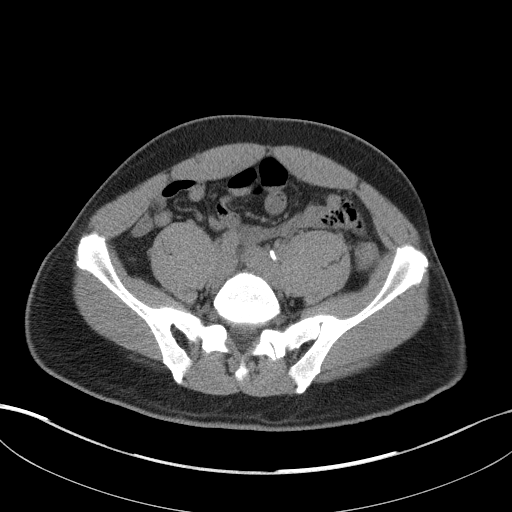
[im 45/94  soft-tissue]
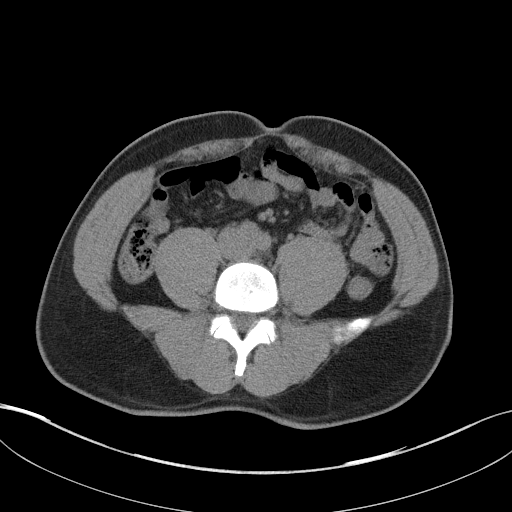
[im 49/94  soft-tissue]
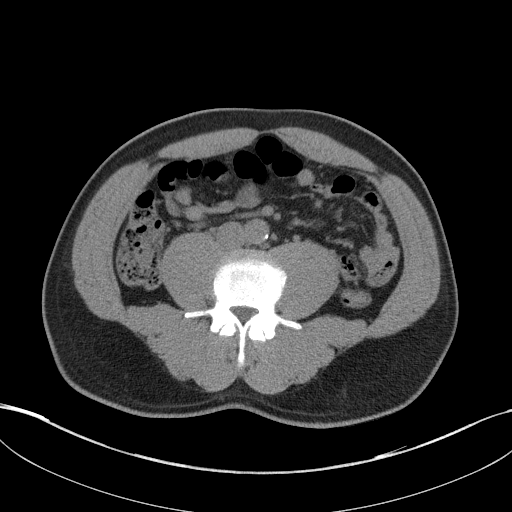
[im 56/94  soft-tissue]
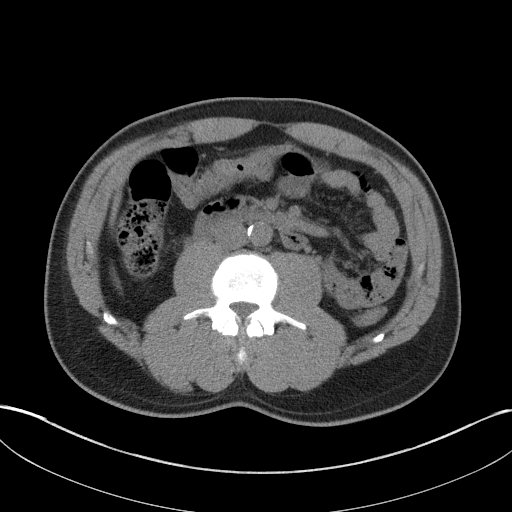
[im 56/94  bone]
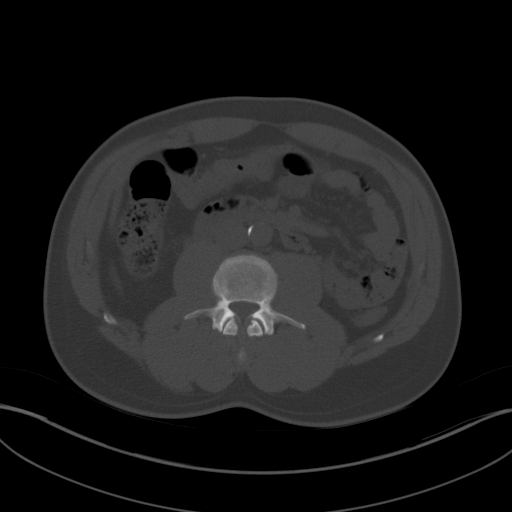
[im 64/94  soft-tissue]
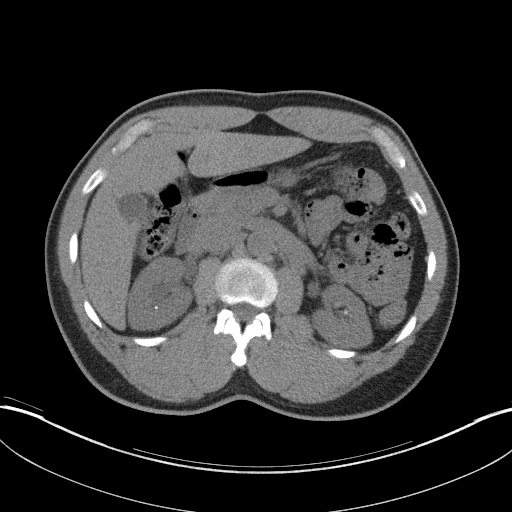
[im 71/94  soft-tissue]
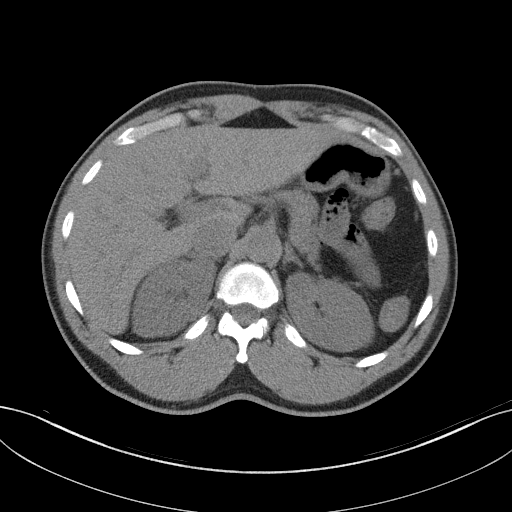
[im 75/94  soft-tissue]
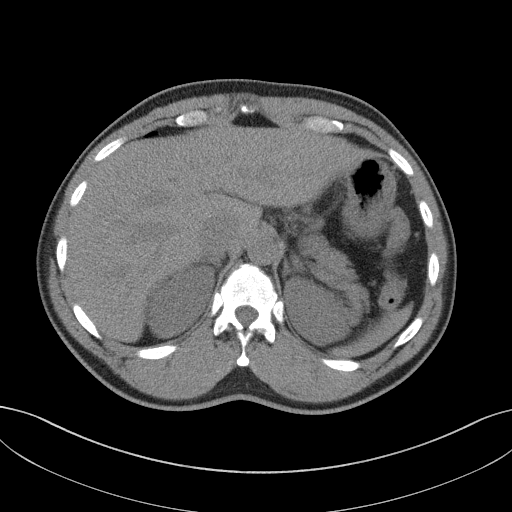
[im 82/94  soft-tissue]
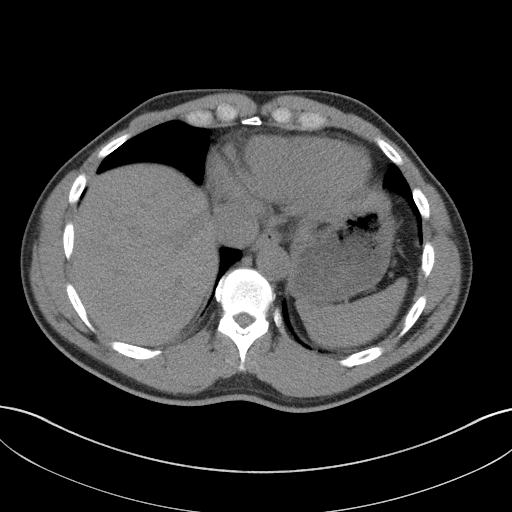
[im 90/94  soft-tissue]
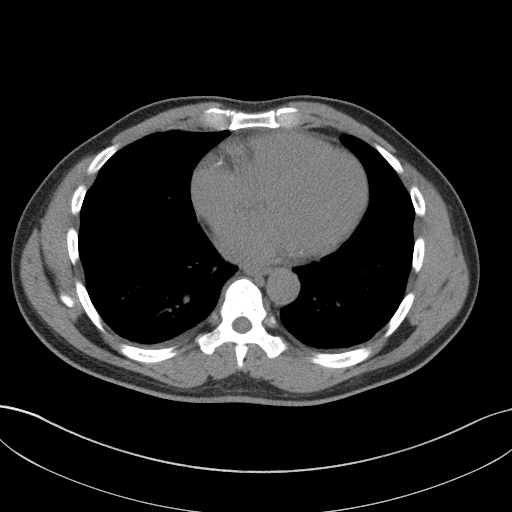

[Series 5: renal stone 3.0 coronal · coronal · 0.90mm/px · 3 of 83 slices shown]
[im 28/83  soft-tissue]
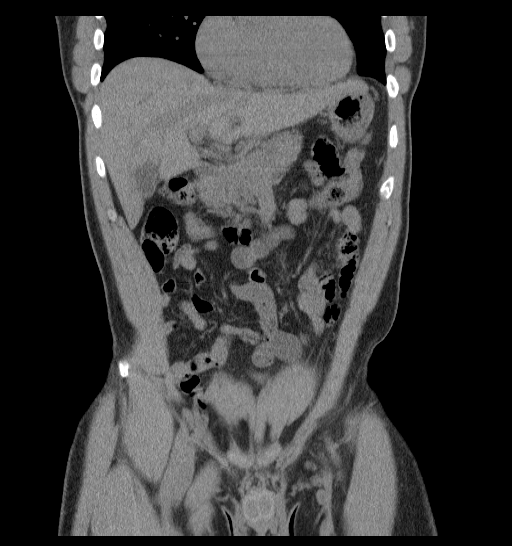
[im 37/83  soft-tissue]
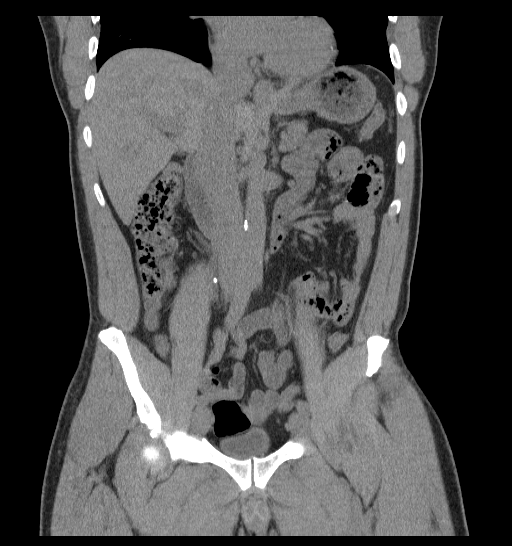
[im 46/83  soft-tissue]
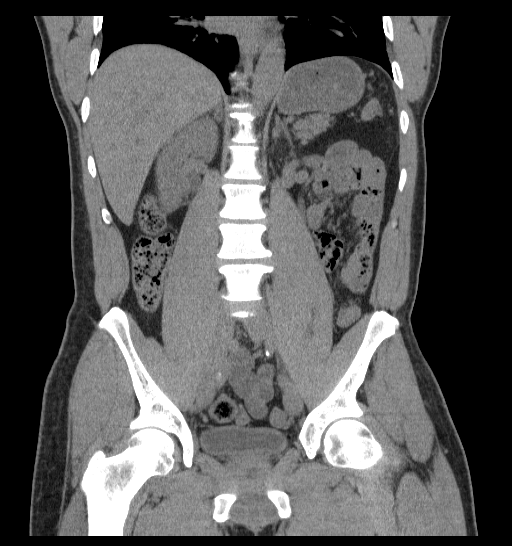

[17 of 46 positions shown; findings below may reference images not displayed]

FINDINGS: Mild dependent atelectasis in the lung bases.  Coronary
artery calcifications.

Multiple punctate sized stones in both kidneys.  There is mild
pyelocaliectasis and ureterectasis on the right side with a 3 ml
stone in the mid right ureter, at the level of L4.  The distal
ureter is decompressed.  No additional ureteral or bladder stones
are demonstrated and there is no ureterectasis on the left side.
No bladder wall thickening.

Calcified granuloma in the spleen.  The unenhanced appearance of
the liver, gallbladder, pancreas, adrenal glands, abdominal aorta,
inferior vena cava, and retroperitoneal lymph nodes is
unremarkable.  The stomach, small bowel, and colon are
decompressed.  No free air or free fluid in the abdomen.  Small
amount of fat in the umbilicus.

Pelvis:  Prostate gland appears enlarged, measuring 6.1 x 4 cm.  No
free or loculated pelvic fluid collections.  Stool in the
rectosigmoid colon.  No evidence of diverticulitis.  The appendix
is normal.  No significant pelvic lymphadenopathy.  Normal
alignment of the lumbar spine.  Degenerative changes in the spine
and hips.
IMPRESSION: 3 mm stone in the mid right ureter with mild proximal obstruction.
Small bilateral nonobstructing intrarenal stones.  Prostate
enlargement.

## 2022-02-04 ENCOUNTER — Encounter: Payer: Self-pay | Admitting: Internal Medicine

## 2022-02-04 ENCOUNTER — Ambulatory Visit (INDEPENDENT_AMBULATORY_CARE_PROVIDER_SITE_OTHER): Payer: BC Managed Care – PPO | Admitting: Internal Medicine

## 2022-02-04 DIAGNOSIS — J4540 Moderate persistent asthma, uncomplicated: Secondary | ICD-10-CM

## 2022-02-04 DIAGNOSIS — R053 Chronic cough: Secondary | ICD-10-CM

## 2022-02-04 DIAGNOSIS — J3089 Other allergic rhinitis: Secondary | ICD-10-CM | POA: Diagnosis not present

## 2022-02-04 MED ORDER — TRELEGY ELLIPTA 200-62.5-25 MCG/ACT IN AEPB: 1.0000 | INHALATION_SPRAY | Freq: Every day | RESPIRATORY_TRACT | 6 refills | Status: DC

## 2022-02-04 MED ORDER — FLUTICASONE PROPIONATE 50 MCG/ACT NA SUSP: 1.0000 | Freq: Every day | NASAL | 2 refills | Status: AC

## 2022-02-04 MED ORDER — CETIRIZINE HCL 10 MG PO TABS
10.0000 mg | ORAL_TABLET | Freq: Every day | ORAL | 1 refills | Status: DC
Start: 2022-02-04 — End: 2022-10-29

## 2022-02-04 MED ORDER — AZELASTINE HCL 0.1 % NA SOLN: 1.0000 | Freq: Two times a day (BID) | NASAL | 6 refills | Status: AC

## 2022-02-04 NOTE — Progress Notes (Signed)
New Patient Note  RE: Brandon Martinez MRN: [id number] DOB: [birth year] Date of Office Visit: 02/04/2022  Consult requested by: Raynelle Jan., MD Primary care provider: Raynelle Jan., MD  Chief Complaint: Asthma  History of Present Illness: I had the pleasure of seeing Brandon Martinez for initial evaluation at the [facility] on 02/04/2022. He is a 61 y.o. male, who is referred here by Raynelle Jan., MD for the evaluation of asthma and chronic cough .  History obtained from patient  and  chart  .  Cough: Initially started 5 years ago , improved after decadron for covid for 8 months then came back after 2 months  Associates: chest tightness, cough, Trial of OCS: MAY 2022 during covid - improved cough Trial of Inhalers: YES - some improved  History of Reflux: trial of famotidine without good response  History of post nasal drainage: denies  Triggers:  denies  Therapies tried: advair 1 puff twice daily (ran out), mucinex (some benefit), flonase (some benefit), singulair ( unclear benefit)  Taking an ACE-I or ARB: YES   Up-to-date with pneumonia Covid-19 Flu vaccines. History of prior pneumonias: denies  History of prior COVID-19 infection: May 2022  Smoking history/exposure: denies    He was diagnosed with asthma 10 years and only required albuterol as needed until cough started 5 years ago.  Denies any hospitalizations for asthma.  Denies any urgent care or ER visits in the past 12 months for asthma.  Only systemic steroid was Decadron during COVID infection in May 2022.     Assessment and Plan: Brandon Martinez is a 61 y.o. male with: Moderate persistent asthma without complication - Plan: Allergy Test, Interdermal Allergy Test  Other allergic rhinitis - Plan: Allergy Test, Interdermal Allergy Test  Chronic cough Plan: Patient Instructions  Etiology of chronic cough is broad. Common considerations include asthma, COPD, allergic rhinitis, nonallergic  rhinitis, infections, reflux (GERD/LPR), neurogenic and/or habitual cough.  Mainstay of treatment is to control all possible triggers and address the cough hypersensitivity aspect.   The history and physical examination suggest this cough is multifactorial and potentially attributed to  Allergic rhinitiis  and uncontrolled asthma.  We will address  Allergic rhinitis  and asthma""COPD at this time.   PLAN:   Allergic rhinitis: Not well controlled  - Testing today showed Epicutaneous testing positive to grass, weed, mold, roach; intradermal testing was positive to ragweed, mold mix 1, mold mix 2, mold mix 3, mold mix 4, dog, dust mite - Copy of test results provided.  - Avoidance measures provided. - Continue with: Singulair (montelukast) 10mg  daily - Start taking: Zyrtec (cetirizine) 10mg  tablet once daily, Flonase (fluticasone) one spray per nostril daily, and Astelin (azelastine) 2 sprays per nostril 1-2 times daily as needed - You can use an extra dose of the antihistamine, if needed, for breakthrough symptoms.  - Consider nasal saline rinses 1-2 times daily to remove allergens from the nasal cavities as well as help with mucous clearance (this is especially helpful to do before the nasal sprays are given) - Consider allergy shots as a means of long-term control and can reduce lifetime use of medications  - Allergy shots "re-train" and "reset" the immune system to ignore environmental allergens and decrease the resulting immune response to those allergens (sneezing, itchy watery eyes, runny nose, nasal congestion, etc).    - Allergy shots improve symptoms in 75-85%  - Allergy shots are the only potential permanent and disease modifying  option  - We can discuss more at the next appointment if the medications are not working for you.  Moderate Persitent  Asthma: not  well controlled - Breathing test today showed: inflammation in your lungs that was partially reversible with albuterol  - This  indicates asthma is not well controlled and we need to step up care.   PLAN:  - Spacer use reviewed. - Daily controller medication(s):  Trelegy 1 puff daily  and Singulair 10mg  daily (this replaces the advair)  - Prior to physical activity: albuterol 2 puffs 10-15 minutes before physical activity. - Rescue medications: albuterol 4 puffs every 4-6 hours as needed - Asthma control goals:  * Full participation in all desired activities (may need albuterol before activity) * Albuterol use two time or less a week on average (not counting use with activity) * Cough interfering with sleep two time or less a month * Oral steroids no more than once a year * No hospitalizations  Follow up: 4 weeks   Thank you so much for letting me partake in your care today.  Don't hesitate to reach out if you have any additional concerns!  , MD  [facility] [place] [place]  Reducing Pollen Exposure  The American Academy of Allergy, Asthma and Immunology suggests the following steps to reduce your exposure to pollen during allergy seasons.    Do not hang sheets or clothing out to dry; pollen may collect on these items. Do not mow lawns or spend time around freshly cut grass; mowing stirs up pollen. Keep windows closed at night.  Keep car windows closed while driving. Minimize morning activities outdoors, a time when pollen counts are usually at their highest. Stay indoors as much as possible when pollen counts or humidity is high and on windy days when pollen tends to remain in the air longer. Use air conditioning when possible.  Many air conditioners have filters that trap the pollen spores. Use a HEPA room air filter to remove pollen form the indoor air you breathe.  DUST MITE AVOIDANCE MEASURES:  There are three main measures that need and can be taken to avoid house dust mites:  Reduce accumulation of dust in general -reduce furniture, clothing, carpeting, books,  stuffed animals, especially in bedroom  Separate yourself from the dust -use pillow and mattress encasements (can be found at stores such as [employer] or online) -avoid direct exposure to air condition flow -use a HEPA filter device, especially in the bedroom; you can also use a HEPA filter vacuum cleaner -wipe dust with a moist towel instead of a dry towel or broom when cleaning  Decrease mites and/or their secretions -wash clothing and linen and stuffed animals at highest temperature possible, at least every 2 weeks -stuffed animals can also be placed in a bag and put in a freezer overnight  Despite the above measures, it is impossible to eliminate dust mites or their allergen completely from your home.  With the above measures the burden of mites in your home can be diminished, with the goal of minimizing your allergic symptoms.  Success will be reached only when implementing and using all means together.  Control of Cockroach Allergen  Cockroach allergen has been identified as an important cause of acute attacks of asthma, especially in urban settings.  There are fifty-five species of cockroach that exist in the [place], however only three, the [place], [place] species produce allergen that can affect  patients with Asthma.  Allergens can be obtained from fecal particles, egg casings and secretions from cockroaches.    Remove food sources. Reduce access to water. Seal access and entry points. Spray runways with 0.5-1% Diazinon or Chlorpyrifos Blow boric acid power under stoves and refrigerator. Place bait stations (hydramethylnon) at feeding sites.  Control of Mold Allergen   Mold and fungi can grow on a variety of surfaces provided certain temperature and moisture conditions exist.  Outdoor molds grow on plants, decaying vegetation and soil.  The major outdoor mold, Alternaria and Cladosporium, are found in very high numbers during hot and dry conditions.   Generally, a late Summer - Fall peak is seen for common outdoor fungal spores.  Rain will temporarily lower outdoor mold spore count, but counts rise rapidly when the rainy period ends.  The most important indoor molds are Aspergillus and Penicillium.  Dark, humid and poorly ventilated basements are ideal sites for mold growth.  The next most common sites of mold growth are the bathroom and the kitchen.  Outdoor (Seasonal) Mold Control  Positive outdoor molds via skin testing: Alternaria, Cladosporium, Bipolaris (Helminthsporium), Drechslera (Curvalaria), and Mucor  Use air conditioning and keep windows closed Avoid exposure to decaying vegetation. Avoid leaf raking. Avoid grain handling. Consider wearing a face mask if working in moldy areas.    Indoor (Perennial) Mold Control   Positive indoor molds via skin testing: Aspergillus, Penicillium, Fusarium, Aureobasidium (Pullulara), Rhizopus, Phoma, and Trichophyton  Maintain humidity below 50%. Clean washable surfaces with 5% bleach solution. Remove sources e.g. contaminated carpets.   Control of Dog or Cat Allergen  Avoidance is the best way to manage a dog or cat allergy. If you have a dog or cat and are allergic to dog or cats, consider removing the dog or cat from the home. If you have a dog or cat but don't want to find it a new home, or if your family wants a pet even though someone in the household is allergic, here are some strategies that may help keep symptoms at bay:  Keep the pet out of your bedroom and restrict it to only a few rooms. Be advised that keeping the dog or cat in only one room will not limit the allergens to that room. Don't pet, hug or kiss the dog or cat; if you do, wash your hands with soap and water. High-efficiency particulate air (HEPA) cleaners run continuously in a bedroom or living room can reduce allergen levels over time. Regular use of a high-efficiency vacuum cleaner or a central vacuum can reduce  allergen levels. Giving your dog or cat a bath at least once a week can reduce airborne allergen.  No follow-ups on file.  Meds ordered this encounter  Medications   Fluticasone-Umeclidin-Vilant (TRELEGY ELLIPTA) 200-62.5-25 MCG/ACT AEPB    Sig: Inhale 1 puff into the lungs daily.    Dispense:  1 each    Refill:  6   cetirizine (ZYRTEC) 10 MG tablet    Sig: Take 1 tablet (10 mg total) by mouth daily.    Dispense:  90 tablet    Refill:  1   fluticasone (FLONASE) 50 MCG/ACT nasal spray    Sig: Place 1 spray into both nostrils daily.    Dispense:  16 g    Refill:  2   azelastine (ASTELIN) 0.1 % nasal spray    Sig: Place 1 spray into both nostrils 2 (two) times daily. Use in each nostril as directed  Dispense:  30 mL    Refill:  6   Lab Orders  No laboratory test(s) ordered today    Other allergy screening: Asthma: yes Rhino conjunctivitis: yes Food allergy: no Medication allergy: no Hymenoptera allergy: no Urticaria: no Eczema:no History of recurrent infections suggestive of immunodeficency: no  Diagnostics: Spirometry:  Tracings reviewed. His effort: Good reproducible efforts. FVC: 2.57 L FEV1: 2.14 L, 65% predicted FEV1/FVC ratio: 83% Interpretation: Spirometry consistent with mixed obstructive and restrictive disease.  After 4 puffs of albuterol Please see scanned spirometry results for details.  Skin Testing: Environmental allergy panel. Adequate controls Epicutaneous testing positive to grass, weed, mold, roach Intradermal testing positive to RW, mold mix 1, mold mix 2, mold mix 3, mixed flora, dog, dust mite Results interpreted by myself and discussed with patient/family.  Airborne Adult Perc - 02/04/22 1143     Time Antigen Placed 1100    Allergen Manufacturer Waynette Buttery    Location Back    Number of Test 59    1. Control-Buffer 50% Glycerol Negative    2. Control-Histamine 1 mg/ml 4+    3. Albumin saline Negative    4. [place] 4+    5. [place] 4+    6.  Johnson 4+    7. [place] Blue 4+    8. Meadow Fescue 4+    9. Perennial Rye 4+    10. Sweet Vernal 4+    11. Timothy 4+    12. Cocklebur Negative    13. Burweed Marshelder Negative    14. Ragweed, short Negative    15. Ragweed, Giant Negative    16. Plantain,  English 2+    17. Lamb's Quarters 2+    18. Sheep Sorrell 3+    19. Rough Pigweed Negative    20. Marsh Elder, Rough Negative    21. Mugwort, Common Negative    22. Ash mix Negative    23. Birch mix 4+    24. Beech American 4+    25. Box, Elder 4+    26. Cedar, red Negative    27. Cottonwood, Guinea[place]Bissau Negative    28. Elm mix Negative    29. Hickory 3+    30. Maple mix 4+    31. Oak, [place] mix 4+    32. Pecan Pollen Negative    33. Pine mix Negative    34. Sycamore Eastern Negative    35. Walnut, Black Pollen Negative    36. Alternaria alternata Negative    37. Cladosporium Herbarum Negative    38. Aspergillus mix Negative    39. Penicillium mix Negative    40. Bipolaris sorokiniana (Helminthosporium) Negative    41. Drechslera spicifera (Curvularia) Negative    42. Mucor plumbeus Negative    43. Fusarium moniliforme Negative    44. Aureobasidium pullulans (pullulara) Negative    45. Rhizopus oryzae Negative    46. Botrytis cinera Negative    47. Epicoccum nigrum Negative    48. Phoma betae 3+    49. Candida Albicans Negative    50. Trichophyton mentagrophytes 3+    51. Mite, D Farinae  5,000 AU/ml Negative    52. Mite, D Pteronyssinus  5,000 AU/ml Negative    53. Cat Hair 10,000 BAU/ml Negative    54.  Dog Epithelia Negative    55. Mixed Feathers Negative    56. Horse Epithelia Negative    57. Cockroach, German 4+    58. Mouse Negative    59. Tobacco Leaf Negative  Intradermal - 02/04/22 1145     Time Antigen Placed --    Allergen Manufacturer --    Location --    Number of Test --    Intradermal --             Past Medical History: There are no problems to display for this  patient.  Past Medical History:  Diagnosis Date   Kidney stone    Renal disorder    kidney stone   Past Surgical History: Past Surgical History:  Procedure Laterality Date   KNEE ARTHROSCOPY     VASECTOMY     Medication List:  Current Outpatient Medications  Medication Sig Dispense Refill   albuterol (VENTOLIN HFA) 108 (90 Base) MCG/ACT inhaler Inhale into the lungs.     allopurinol (ZYLOPRIM) 100 MG tablet Take 100 mg by mouth every other day.      amLODipine-olmesartan (AZOR) 5-20 MG per tablet Take 1 tablet by mouth daily.     atorvastatin (LIPITOR) 20 MG tablet Take 20 mg by mouth every other day.     azelastine (ASTELIN) 0.1 % nasal spray Place 1 spray into both nostrils 2 (two) times daily. Use in each nostril as directed 30 mL 6   cetirizine (ZYRTEC) 10 MG tablet Take 1 tablet (10 mg total) by mouth daily. 90 tablet 1   cyanocobalamin (VITAMIN B12) 1000 MCG tablet Take 1,000 mcg by mouth daily.     famotidine (PEPCID) 40 MG tablet Take 40 mg by mouth daily.     fluticasone (FLONASE) 50 MCG/ACT nasal spray Place into the nose.     fluticasone (FLONASE) 50 MCG/ACT nasal spray Place 1 spray into both nostrils daily. 16 g 2   Fluticasone-Umeclidin-Vilant (TRELEGY ELLIPTA) 200-62.5-25 MCG/ACT AEPB Inhale 1 puff into the lungs daily. 1 each 6   montelukast (SINGULAIR) 10 MG tablet      No current facility-administered medications for this visit.   Allergies: Allergies  Allergen Reactions   Iodine Swelling   Shellfish Allergy Swelling   Social History: Social History   Socioeconomic History   Marital status: Married    Spouse name: Not on file   Number of children: Not on file   Years of education: Not on file   Highest education level: Not on file  Occupational History   Not on file  Tobacco Use   Smoking status: Never    Passive exposure: Never   Smokeless tobacco: Never  Vaping Use   Vaping Use: Never used  Substance and Sexual Activity   Alcohol use: Yes    Drug use: No   Sexual activity: Not on file  Other Topics Concern   Not on file  Social History Narrative   Not on file   Social Determinants of Health   Financial Resource Strain: Not on file  Food Insecurity: Not on file  Transportation Needs: Not on file  Physical Activity: Not on file  Stress: Not on file  Social Connections: Not on file   Lives in a single-family home that is 59 years old.  There are no roaches in the house but is to get off the floor.  He does have dust mite precautions on bed but not pillow.  He is not exposed to fumes, chemicals or dust.  There is a HEPA filter in the home and he does not live near an interstate industrial area. Smoking: No exposure Occupation: Retired  Landscape architect HistorySurveyor, minerals in the house: no Engineer, civil (consulting) in the family room:  no Carpet in the bedroom: no Heating: gas Cooling: central Pet: no  Family History: Family History  Problem Relation Age of Onset   Allergic rhinitis Neg Hx    Angioedema Neg Hx    Asthma Neg Hx    Atopy Neg Hx    Eczema Neg Hx    Urticaria Neg Hx    Immunodeficiency Neg Hx      ROS: All others negative except as noted per HPI.   Objective: BP 110/70   Pulse 65   Temp 97.9 F (36.6 C) (Temporal)   Resp 16   Ht 6' (1.829 m)   Wt 214 lb 8 oz (97.3 kg)   SpO2 97%   BMI 29.09 kg/m  Body mass index is 29.09 kg/m.  General Appearance:  Alert, cooperative, no distress, appears stated age  Head:  Normocephalic, without obvious abnormality, atraumatic  Eyes:  Conjunctiva clear, EOM's intact  Nose: Nares normal, normal mucosa, no visible anterior polyps, and septum midline  Throat: Lips, tongue normal; teeth and gums normal, + cobblestoning  Neck: Supple, symmetrical  Lungs:   clear to auscultation bilaterally, Respirations unlabored, no coughing  Heart:  regular rate and rhythm and no murmur, Appears well perfused  Extremities: No edema  Skin: Skin color, texture, turgor normal, no  rashes or lesions on visualized portions of skin  Neurologic: No gross deficits   The plan was reviewed with the patient/family, and all questions/concerned were addressed.  It was my pleasure to see Brandon Martinez today and participate in his care. Please feel free to contact me with any questions or concerns.  Sincerely,  Ferol LuzEvelyn Maurilio Puryear, MD Allergy & Immunology  [facility] [place] office: [phone] [facility] [place] office: [phone]

## 2022-02-04 NOTE — Patient Instructions (Addendum)
Etiology of chronic cough is broad. Common considerations include asthma, COPD, allergic rhinitis, nonallergic rhinitis, infections, reflux (GERD/LPR), neurogenic and/or habitual cough.  Mainstay of treatment is to control all possible triggers and address the cough hypersensitivity aspect.   The history and physical examination suggest this cough is multifactorial and potentially attributed to  Allergic rhinitiis  and uncontrolled asthma.  We will address  Allergic rhinitis  and asthma""COPD at this time.   PLAN:   Allergic rhinitis: Not well controlled  - Testing today showed Epicutaneous testing positive to grass, weed, mold, roach; intradermal testing was positive to ragweed, mold mix 1, mold mix 2, mold mix 3, mold mix 4, dog, dust mite - Copy of test results provided.  - Avoidance measures provided. - Continue with: Singulair (montelukast) 10mg  daily - Start taking: Zyrtec (cetirizine) 10mg  tablet once daily, Flonase (fluticasone) one spray per nostril daily, and Astelin (azelastine) 2 sprays per nostril 1-2 times daily as needed - You can use an extra dose of the antihistamine, if needed, for breakthrough symptoms.  - Consider nasal saline rinses 1-2 times daily to remove allergens from the nasal cavities as well as help with mucous clearance (this is especially helpful to do before the nasal sprays are given) - Consider allergy shots as a means of long-term control and can reduce lifetime use of medications  - Allergy shots "re-train" and "reset" the immune system to ignore environmental allergens and decrease the resulting immune response to those allergens (sneezing, itchy watery eyes, runny nose, nasal congestion, etc).    - Allergy shots improve symptoms in 75-85%  - Allergy shots are the only potential permanent and disease modifying option  - We can discuss more at the next appointment if the medications are not working for you.  Moderate Persitent  Asthma: not  well controlled -  Breathing test today showed: inflammation in your lungs that was partially reversible with albuterol  - This indicates asthma is not well controlled and we need to step up care.   PLAN:  - Spacer use reviewed. - Daily controller medication(s):  Trelegy 1 puff daily  and Singulair 10mg  daily (this replaces the advair)  - Prior to physical activity: albuterol 2 puffs 10-15 minutes before physical activity. - Rescue medications: albuterol 4 puffs every 4-6 hours as needed - Asthma control goals:  * Full participation in all desired activities (may need albuterol before activity) * Albuterol use two time or less a week on average (not counting use with activity) * Cough interfering with sleep two time or less a month * Oral steroids no more than once a year * No hospitalizations  Follow up: 4 weeks   Thank you so much for letting me partake in your care today.  Don't hesitate to reach out if you have any additional concerns!  09-18-1995, MD  [facility] [place] [place]  Reducing Pollen Exposure  The American Academy of Allergy, Asthma and Immunology suggests the following steps to reduce your exposure to pollen during allergy seasons.    Do not hang sheets or clothing out to dry; pollen may collect on these items. Do not mow lawns or spend time around freshly cut grass; mowing stirs up pollen. Keep windows closed at night.  Keep car windows closed while driving. Minimize morning activities outdoors, a time when pollen counts are usually at their highest. Stay indoors as much as possible when pollen counts or humidity is high and on windy days when pollen  tends to remain in the air longer. Use air conditioning when possible.  Many air conditioners have filters that trap the pollen spores. Use a HEPA room air filter to remove pollen form the indoor air you breathe.  DUST MITE AVOIDANCE MEASURES:  There are three main measures that need and can be taken to  avoid house dust mites:  Reduce accumulation of dust in general -reduce furniture, clothing, carpeting, books, stuffed animals, especially in bedroom  Separate yourself from the dust -use pillow and mattress encasements (can be found at stores such as [employer] or online) -avoid direct exposure to air condition flow -use a HEPA filter device, especially in the bedroom; you can also use a HEPA filter vacuum cleaner -wipe dust with a moist towel instead of a dry towel or broom when cleaning  Decrease mites and/or their secretions -wash clothing and linen and stuffed animals at highest temperature possible, at least every 2 weeks -stuffed animals can also be placed in a bag and put in a freezer overnight  Despite the above measures, it is impossible to eliminate dust mites or their allergen completely from your home.  With the above measures the burden of mites in your home can be diminished, with the goal of minimizing your allergic symptoms.  Success will be reached only when implementing and using all means together.  Control of Cockroach Allergen  Cockroach allergen has been identified as an important cause of acute attacks of asthma, especially in urban settings.  There are fifty-five species of cockroach that exist in the [place], however only three, the [place], [place] species produce allergen that can affect patients with Asthma.  Allergens can be obtained from fecal particles, egg casings and secretions from cockroaches.    Remove food sources. Reduce access to water. Seal access and entry points. Spray runways with 0.5-1% Diazinon or Chlorpyrifos Blow boric acid power under stoves and refrigerator. Place bait stations (hydramethylnon) at feeding sites.  Control of Mold Allergen   Mold and fungi can grow on a variety of surfaces provided certain temperature and moisture conditions exist.  Outdoor molds grow on plants, decaying vegetation and soil.   The major outdoor mold, Alternaria and Cladosporium, are found in very high numbers during hot and dry conditions.  Generally, a late Summer - Fall peak is seen for common outdoor fungal spores.  Rain will temporarily lower outdoor mold spore count, but counts rise rapidly when the rainy period ends.  The most important indoor molds are Aspergillus and Penicillium.  Dark, humid and poorly ventilated basements are ideal sites for mold growth.  The next most common sites of mold growth are the bathroom and the kitchen.  Outdoor (Seasonal) Mold Control  Positive outdoor molds via skin testing: Alternaria, Cladosporium, Bipolaris (Helminthsporium), Drechslera (Curvalaria), and Mucor  Use air conditioning and keep windows closed Avoid exposure to decaying vegetation. Avoid leaf raking. Avoid grain handling. Consider wearing a face mask if working in moldy areas.    Indoor (Perennial) Mold Control   Positive indoor molds via skin testing: Aspergillus, Penicillium, Fusarium, Aureobasidium (Pullulara), Rhizopus, Phoma, and Trichophyton  Maintain humidity below 50%. Clean washable surfaces with 5% bleach solution. Remove sources e.g. contaminated carpets.   Control of Dog or Cat Allergen  Avoidance is the best way to manage a dog or cat allergy. If you have a dog or cat and are allergic to dog or cats, consider removing the dog or cat from the home. If you  have a dog or cat but don't want to find it a new home, or if your family wants a pet even though someone in the household is allergic, here are some strategies that may help keep symptoms at bay:  Keep the pet out of your bedroom and restrict it to only a few rooms. Be advised that keeping the dog or cat in only one room will not limit the allergens to that room. Don't pet, hug or kiss the dog or cat; if you do, wash your hands with soap and water. High-efficiency particulate air (HEPA) cleaners run continuously in a bedroom or living room can  reduce allergen levels over time. Regular use of a high-efficiency vacuum cleaner or a central vacuum can reduce allergen levels. Giving your dog or cat a bath at least once a week can reduce airborne allergen.

## 2022-02-05 NOTE — Addendum Note (Signed)
Addended by: Berna Bue on: 02/05/2022 07:51 AM   Modules accepted: Orders

## 2022-02-11 ENCOUNTER — Telehealth: Payer: Self-pay | Admitting: Internal Medicine

## 2022-02-11 NOTE — Telephone Encounter (Signed)
PCP would like notes from [facility]

## 2022-02-11 NOTE — Telephone Encounter (Signed)
Notes have been faxed to pcp

## 2022-03-12 ENCOUNTER — Encounter: Payer: Self-pay | Admitting: Internal Medicine

## 2022-03-12 ENCOUNTER — Ambulatory Visit (INDEPENDENT_AMBULATORY_CARE_PROVIDER_SITE_OTHER): Payer: BC Managed Care – PPO | Admitting: Internal Medicine

## 2022-03-12 VITALS — BP 128/76 | HR 64 | Temp 98.6°F | Resp 18 | Wt 214.9 lb

## 2022-03-12 DIAGNOSIS — J4540 Moderate persistent asthma, uncomplicated: Secondary | ICD-10-CM | POA: Diagnosis not present

## 2022-03-12 DIAGNOSIS — J3089 Other allergic rhinitis: Secondary | ICD-10-CM

## 2022-03-12 MED ORDER — EPINEPHRINE 0.3 MG/0.3ML IJ SOAJ: 0.3000 mg | INTRAMUSCULAR | 1 refills | Status: AC | PRN

## 2022-03-12 NOTE — Progress Notes (Signed)
Follow Up Note  RE: Angus Amini MRN: [id number] DOB: [birth year] Date of Office Visit: 03/12/2022  Referring provider: Raynelle Jan., MD Primary care provider: Raynelle Jan., MD  Chief Complaint: Follow-up and Cough (Follow up cough, asthma doing better on new medication x 1 month)  History of Present Illness: I had the pleasure of seeing Steven Basso for a follow up visit at the [facility] on 03/12/2022. He is a 61 y.o. male, who is being followed for chronic cough, persistent asthma, allergic rhinitis. His previous allergy office visit was on 02/04/22 with Dr. Marlynn Perking. Today is a regular follow up visit.  History obtained from patient, chart review.  ASTHMA - Medical therapy: Trelegy 1 puff daily, Montelukast 10mg  daily  - Rescue inhaler use: rare use  - Symptoms: cough with yardwork and after rain  - Exacerbation history: 0  ABX for respiratory illness since last visit, MAY 2022 during covid OCS, 0ED, 0 UC visits in the past year  - ACT: 25 /25 - Adverse effects of medication: denies  - Previous FEV1: 2.57 L, 65% - Biologic Labs not done   Allergic Rhinitis: current therapy: Montelukast 10 mg daily, Zyrtec 10 mg daily, Flonase nasal spray, Astelin nasal spray,  symptoms improved symptoms include: nasal congestion, rhinorrhea, and post nasal drainage Previous allergy testing:  02/04/22: SPT: positive to grass, weed, mold, roach; intradermal was positive to ragweed, mold mix 1, mold mix 2, mold mix 3, mold mix 4, dog, dust mite History of reflux/heartburn: no Interested in Allergy Immunotherapy: yes   Assessment and Plan: Hanz is a 61 y.o. male with: Moderate persistent asthma without complication - Plan: Spirometry with Graph  Other allergic rhinitis - Plan: Spirometry with Graph Plan: Patient Instructions  Allergic rhinitis: improved  - testing 02/05/22:  Epicutaneous testing positive to grass, weed, mold, roach; intradermal testing was  positive to ragweed, mold mix 1, mold mix 2, mold mix 3, mold mix 4, dog, dust mite - Continue avoidance measures  - Continue with: Zyrtec (cetirizine) 10mg  tablet once daily, Singulair (montelukast) 10mg  daily, Flonase (fluticasone) two sprays per nostril daily, and Astelin (azelastine) 2 sprays per nostril 1-2 times daily as needed - Start allergy injections. Had a detailed discussion with patient/family that clinical history is suggestive of allergic rhinitis, and may benefit from allergy immunotherapy (AIT). Discussed in detail regarding the dosing, schedule, side effects (mild to moderate local allergic reaction and rarely systemic allergic reactions including anaphylaxis/death), alternatives and benefits (significant improvement in nasal symptoms, seasonal flares of asthma) of immunotherapy with the patient. There is significant time commitment involved with allergy shots, which includes weekly immunotherapy injections for first 9-12 months and then biweekly to monthly injections for 3-5 years. Clinical response is often delayed and patient may not see an improvement for 6-12 months. Consent was signed. I have prescribed epinephrine injectable and demonstrated proper use. For mild symptoms you can take over the counter antihistamines such as Benadryl and monitor symptoms closely. If symptoms worsen or if you have severe symptoms including breathing issues, throat closure, significant swelling, whole body hives, severe diarrhea and vomiting, lightheadedness then inject epinephrine and seek immediate medical care afterwards. Action plan given.   Moderate Persitent  Asthma:  Improved  - Breathing test today showed: improved from prior! Some still mild reversible inflammation in your lungs   PLAN:  - Spacer use reviewed. - Daily controller medication(s):  Trelegy 04/07/22 1 puff daily  and Singulair 10mg  daily (this  replaces the advair)  - Prior to physical activity: albuterol 2 puffs 10-15 minutes  before physical activity. - Rescue medications: albuterol 4 puffs every 4-6 hours as needed - Asthma control goals:  * Full participation in all desired activities (may need albuterol before activity) * Albuterol use two time or less a week on average (not counting use with activity) * Cough interfering with sleep two time or less a month * Oral steroids no more than once a year * No hospitalizations  Follow up: 4 months  Schedule first shot appointment in 4 weeks to signal our mixing lab to mix your vials   Thank you so much for letting me partake in your care today.  Don't hesitate to reach out if you have any additional concerns!  Ferol Luz, MD  [facility] [place] [place]   No follow-ups on file.  Meds ordered this encounter  Medications   EPINEPHrine 0.3 mg/0.3 mL IJ SOAJ injection    Sig: Inject 0.3 mg into the muscle as needed for anaphylaxis.    Dispense:  1 each    Refill:  1    Lab Orders  No laboratory test(s) ordered today   Diagnostics: Spirometry:  Tracings reviewed. His effort: Good reproducible efforts. FVC: 3.05 L FEV1: 2.39 L, 73% predicted FEV1/FVC ratio: 78% Interpretation: Spirometry consistent with possible restrictive disease.  After 4 puffs of albuterol FEV1 increased by 1% and 20 cc, FVC did not change at all.  This is not a significant postbronchodilator response Please see scanned spirometry results for details.  Results interpreted by myself during this encounter and discussed with patient/family.   Medication List:  Current Outpatient Medications  Medication Sig Dispense Refill   albuterol (VENTOLIN HFA) 108 (90 Base) MCG/ACT inhaler Inhale into the lungs.     allopurinol (ZYLOPRIM) 100 MG tablet Take 100 mg by mouth every other day.      amLODipine-olmesartan (AZOR) 5-20 MG per tablet Take 1 tablet by mouth daily.     atorvastatin (LIPITOR) 20 MG tablet Take 20 mg by mouth every other day.     azelastine (ASTELIN)  0.1 % nasal spray Place 1 spray into both nostrils 2 (two) times daily. Use in each nostril as directed 30 mL 6   cetirizine (ZYRTEC) 10 MG tablet Take 1 tablet (10 mg total) by mouth daily. 90 tablet 1   cyanocobalamin (VITAMIN B12) 1000 MCG tablet Take 1,000 mcg by mouth daily.     EPINEPHrine 0.3 mg/0.3 mL IJ SOAJ injection Inject 0.3 mg into the muscle as needed for anaphylaxis. 1 each 1   famotidine (PEPCID) 40 MG tablet Take 40 mg by mouth daily.     fluticasone (FLONASE) 50 MCG/ACT nasal spray Place into the nose.     fluticasone (FLONASE) 50 MCG/ACT nasal spray Place 1 spray into both nostrils daily. 16 g 2   Fluticasone-Umeclidin-Vilant (TRELEGY ELLIPTA) 200-62.5-25 MCG/ACT AEPB Inhale 1 puff into the lungs daily. 1 each 6   montelukast (SINGULAIR) 10 MG tablet      No current facility-administered medications for this visit.   Allergies: Allergies  Allergen Reactions   Iodine Swelling   Shellfish Allergy Swelling   I reviewed his past medical history, social history, family history, and environmental history and no significant changes have been reported from his previous visit.  ROS: All others negative except as noted per HPI.   Objective: BP 128/76 (BP Location: Left Arm, Patient Position: Sitting, Cuff Size: Normal)   Pulse  64   Temp 98.6 F (37 C) (Temporal)   Resp 18   Wt 214 lb 14.4 oz (97.5 kg)   SpO2 98%   BMI 29.15 kg/m  Body mass index is 29.15 kg/m. General Appearance:  Alert, cooperative, no distress, appears stated age  Head:  Normocephalic, without obvious abnormality, atraumatic  Eyes:  Conjunctiva clear, EOM's intact  Nose: Nares normal, hypertrophic turbinates, no visible anterior polyps, and septum midline  Throat: Lips, tongue normal; teeth and gums normal, normal posterior oropharynx and no tonsillar exudate  Neck: Supple, symmetrical  Lungs:   clear to auscultation bilaterally, Respirations unlabored, no coughing  Heart:  regular rate and rhythm  and no murmur, Appears well perfused  Extremities: No edema  Skin: Skin color, texture, turgor normal, no rashes or lesions on visualized portions of skin   Neurologic: No gross deficits   Previous notes and tests were reviewed. The plan was reviewed with the patient/family, and all questions/concerned were addressed.  It was my pleasure to see Brandon Martinez today and participate in his care. Please feel free to contact me with any questions or concerns.  Sincerely,  Ferol Luz, MD  Allergy & Immunology  [facility] Office: [phone]

## 2022-03-12 NOTE — Patient Instructions (Signed)
Allergic rhinitis: improved  - testing 02/05/22:  Epicutaneous testing positive to grass, weed, mold, roach; intradermal testing was positive to ragweed, mold mix 1, mold mix 2, mold mix 3, mold mix 4, dog, dust mite - Continue avoidance measures  - Continue with: Zyrtec (cetirizine) 10mg  tablet once daily, Singulair (montelukast) 10mg  daily, Flonase (fluticasone) two sprays per nostril daily, and Astelin (azelastine) 2 sprays per nostril 1-2 times daily as needed - Start allergy injections. Had a detailed discussion with patient/family that clinical history is suggestive of allergic rhinitis, and may benefit from allergy immunotherapy (AIT). Discussed in detail regarding the dosing, schedule, side effects (mild to moderate local allergic reaction and rarely systemic allergic reactions including anaphylaxis/death), alternatives and benefits (significant improvement in nasal symptoms, seasonal flares of asthma) of immunotherapy with the patient. There is significant time commitment involved with allergy shots, which includes weekly immunotherapy injections for first 9-12 months and then biweekly to monthly injections for 3-5 years. Clinical response is often delayed and patient may not see an improvement for 6-12 months. Consent was signed. I have prescribed epinephrine injectable and demonstrated proper use. For mild symptoms you can take over the counter antihistamines such as Benadryl and monitor symptoms closely. If symptoms worsen or if you have severe symptoms including breathing issues, throat closure, significant swelling, whole body hives, severe diarrhea and vomiting, lightheadedness then inject epinephrine and seek immediate medical care afterwards. Action plan given.   Moderate Persitent  Asthma:  Improved  - Breathing test today showed: improved from prior! Some still mild reversible inflammation in your lungs   PLAN:  - Spacer use reviewed. - Daily controller medication(s):  Trelegy 1  puff daily  and Singulair 10mg  daily (this replaces the advair)  - Prior to physical activity: albuterol 2 puffs 10-15 minutes before physical activity. - Rescue medications: albuterol 4 puffs every 4-6 hours as needed - Asthma control goals:  * Full participation in all desired activities (may need albuterol before activity) * Albuterol use two time or less a week on average (not counting use with activity) * Cough interfering with sleep two time or less a month * Oral steroids no more than once a year * No hospitalizations  Follow up: 4 months  Schedule first shot appointment in 4 weeks to signal our mixing lab to mix your vials   Thank you so much for letting me partake in your care today.  Don't hesitate to reach out if you have any additional concerns!  , MD  [facility] [place] [place]

## 2022-03-18 NOTE — Progress Notes (Signed)
VIALS EXP 03-19-23 

## 2022-03-18 NOTE — Progress Notes (Signed)
Aeroallergen Immunotherapy   Ordering Provider: Dr. Roney Marion   Patient Details  Name: Brandon Martinez  MRN: [id number]  Date of Birth: 21-Oct-1960   Order 2 of 2   Vial Label: (M-R-Dm)   0.2 ml (Volume)  1:20 Concentration -- Alternaria alternata  0.2 ml (Volume)  1:20 Concentration -- Cladosporium herbarum  0.2 ml (Volume)  1:10 Concentration -- Aspergillus mix  0.2 ml (Volume)  1:10 Concentration -- Penicillium mix  0.2 ml (Volume)  1:20 Concentration -- Bipolaris sorokiniana  0.2 ml (Volume)  1:20 Concentration -- Drechslera spicifera  0.2 ml (Volume)  1:10 Concentration -- Mucor plumbeus  0.2 ml (Volume)  1:10 Concentration -- Fusarium moniliforme  0.2 ml (Volume)  1:40 Concentration -- Aureobasidium pullulans  0.2 ml (Volume)  1:10 Concentration -- Rhizopus oryzae  0.2 ml (Volume)  1:40 Concentration -- Phoma betae  0.3 ml (Volume)  1:20 Concentration -- Cockroach, [place]  0.5 ml (Volume)   AU Concentration -- Mite Mix (DF 5,000 & DP 5,000)    3.0  ml Extract Subtotal  2.0  ml Diluent  5.0  ml Maintenance Total   Schedule:    Blue Vial (1:100,000): Schedule B (6 doses)  Yellow Vial (1:10,000): Schedule B (6 doses)  Green Vial (1:1,000): Schedule B (6 doses)  Red Vial (1:100): Schedule A (10 doses)   Special Instructions: none

## 2022-03-18 NOTE — Progress Notes (Signed)
Aeroallergen Immunotherapy   Ordering Provider: Dr. Roney Marion   Patient Details  Name: Brandon Martinez  MRN: [id number]  Date of Birth: 1961/03/22   Order 1 of 2   Vial Label: (G-W-T-D)   0.3 ml (Volume)  BAU Concentration -- 7 Grass Mix* 100,000 ([address] [place], [place], [place], [place], [place], [place], [place])  0.2 ml (Volume)  1:20 Concentration -- [place]  0.3 ml (Volume)  BAU Concentration -- [place] 10,000  0.2 ml (Volume)  1:20 Concentration -- [place]  0.2 ml (Volume)  1:10 Concentration -- Plantain [place]  0.2 ml (Volume)  1:20 Concentration -- [place]*  0.2 ml (Volume)  1:10 Concentration -- Sheep [place]*  0.2 ml (Volume)  1:10 Concentration -- Birch mix*  0.2 ml (Volume)  1:20 Concentration -- [place]  0.2 ml (Volume)  1:10 Concentration -- [place]*  0.2 ml (Volume)  1:20 Concentration -- Maple Mix*  0.3 ml (Volume)  1:10 Concentration -- Oak, [place] mix*  0.5 ml (Volume)  1:10 Concentration -- Dog Epithelia    3.2  ml Extract Subtotal  1.8  ml Diluent  5.0  ml Maintenance Total   Schedule:    Blue Vial (1:100,000): Schedule B (6 doses)  Yellow Vial (1:10,000): Schedule B (6 doses)  Green Vial (1:1,000): Schedule B (6 doses)  Red Vial (1:100): Schedule A (10 doses)   Special Instructions: none

## 2022-03-22 DIAGNOSIS — J3081 Allergic rhinitis due to animal (cat) (dog) hair and dander: Secondary | ICD-10-CM

## 2022-03-25 DIAGNOSIS — J3089 Other allergic rhinitis: Secondary | ICD-10-CM | POA: Diagnosis not present

## 2022-05-02 ENCOUNTER — Ambulatory Visit (INDEPENDENT_AMBULATORY_CARE_PROVIDER_SITE_OTHER): Payer: BC Managed Care – PPO

## 2022-05-02 DIAGNOSIS — J309 Allergic rhinitis, unspecified: Secondary | ICD-10-CM

## 2022-05-02 NOTE — Progress Notes (Signed)
Immunotherapy   Patient Details  Name: Brandon Martinez MRN: [id number] Date of Birth: 1960-07-07  05/02/2022  Brandon Martinez started injections for Blue 1:100,000 (G-W-T-D and M-CR-DM) Following schedule: B  Frequency:1 time per week Epi-Pen:Epi-Pen Available  Consent signed and patient instructions given.   Lind Ausley J Bhavik Cabiness 05/02/2022, 10:38 AM

## 2022-05-08 ENCOUNTER — Ambulatory Visit (INDEPENDENT_AMBULATORY_CARE_PROVIDER_SITE_OTHER): Payer: BC Managed Care – PPO

## 2022-05-08 DIAGNOSIS — J309 Allergic rhinitis, unspecified: Secondary | ICD-10-CM | POA: Diagnosis not present

## 2022-05-15 ENCOUNTER — Ambulatory Visit (INDEPENDENT_AMBULATORY_CARE_PROVIDER_SITE_OTHER): Payer: BC Managed Care – PPO

## 2022-05-15 DIAGNOSIS — J309 Allergic rhinitis, unspecified: Secondary | ICD-10-CM

## 2022-05-22 ENCOUNTER — Ambulatory Visit (INDEPENDENT_AMBULATORY_CARE_PROVIDER_SITE_OTHER): Payer: BC Managed Care – PPO | Admitting: *Deleted

## 2022-05-22 DIAGNOSIS — J309 Allergic rhinitis, unspecified: Secondary | ICD-10-CM | POA: Diagnosis not present

## 2022-05-31 ENCOUNTER — Ambulatory Visit (INDEPENDENT_AMBULATORY_CARE_PROVIDER_SITE_OTHER): Payer: BC Managed Care – PPO

## 2022-05-31 DIAGNOSIS — J309 Allergic rhinitis, unspecified: Secondary | ICD-10-CM

## 2022-06-06 ENCOUNTER — Ambulatory Visit (INDEPENDENT_AMBULATORY_CARE_PROVIDER_SITE_OTHER): Payer: BC Managed Care – PPO

## 2022-06-06 DIAGNOSIS — J309 Allergic rhinitis, unspecified: Secondary | ICD-10-CM | POA: Diagnosis not present

## 2022-06-12 ENCOUNTER — Ambulatory Visit (INDEPENDENT_AMBULATORY_CARE_PROVIDER_SITE_OTHER): Payer: BC Managed Care – PPO

## 2022-06-12 DIAGNOSIS — J309 Allergic rhinitis, unspecified: Secondary | ICD-10-CM

## 2022-06-19 ENCOUNTER — Ambulatory Visit (INDEPENDENT_AMBULATORY_CARE_PROVIDER_SITE_OTHER): Payer: BC Managed Care – PPO

## 2022-06-19 DIAGNOSIS — J309 Allergic rhinitis, unspecified: Secondary | ICD-10-CM

## 2022-07-03 ENCOUNTER — Ambulatory Visit (INDEPENDENT_AMBULATORY_CARE_PROVIDER_SITE_OTHER): Payer: BC Managed Care – PPO

## 2022-07-03 DIAGNOSIS — J309 Allergic rhinitis, unspecified: Secondary | ICD-10-CM | POA: Diagnosis not present

## 2022-07-10 ENCOUNTER — Ambulatory Visit (INDEPENDENT_AMBULATORY_CARE_PROVIDER_SITE_OTHER): Payer: BC Managed Care – PPO

## 2022-07-10 DIAGNOSIS — J309 Allergic rhinitis, unspecified: Secondary | ICD-10-CM

## 2022-07-17 ENCOUNTER — Ambulatory Visit (INDEPENDENT_AMBULATORY_CARE_PROVIDER_SITE_OTHER): Payer: BC Managed Care – PPO

## 2022-07-17 DIAGNOSIS — J309 Allergic rhinitis, unspecified: Secondary | ICD-10-CM

## 2022-07-24 ENCOUNTER — Ambulatory Visit (INDEPENDENT_AMBULATORY_CARE_PROVIDER_SITE_OTHER): Payer: BC Managed Care – PPO

## 2022-07-24 DIAGNOSIS — J309 Allergic rhinitis, unspecified: Secondary | ICD-10-CM | POA: Diagnosis not present

## 2022-07-31 ENCOUNTER — Ambulatory Visit (INDEPENDENT_AMBULATORY_CARE_PROVIDER_SITE_OTHER): Payer: BC Managed Care – PPO

## 2022-07-31 DIAGNOSIS — J309 Allergic rhinitis, unspecified: Secondary | ICD-10-CM | POA: Diagnosis not present

## 2022-08-07 ENCOUNTER — Ambulatory Visit (INDEPENDENT_AMBULATORY_CARE_PROVIDER_SITE_OTHER): Payer: No Typology Code available for payment source

## 2022-08-07 DIAGNOSIS — J309 Allergic rhinitis, unspecified: Secondary | ICD-10-CM | POA: Diagnosis not present

## 2022-08-14 ENCOUNTER — Ambulatory Visit (INDEPENDENT_AMBULATORY_CARE_PROVIDER_SITE_OTHER): Payer: No Typology Code available for payment source

## 2022-08-14 DIAGNOSIS — J309 Allergic rhinitis, unspecified: Secondary | ICD-10-CM | POA: Diagnosis not present

## 2022-08-21 ENCOUNTER — Ambulatory Visit (INDEPENDENT_AMBULATORY_CARE_PROVIDER_SITE_OTHER): Payer: No Typology Code available for payment source

## 2022-08-21 DIAGNOSIS — J309 Allergic rhinitis, unspecified: Secondary | ICD-10-CM

## 2022-08-28 ENCOUNTER — Ambulatory Visit (INDEPENDENT_AMBULATORY_CARE_PROVIDER_SITE_OTHER): Payer: No Typology Code available for payment source

## 2022-08-28 DIAGNOSIS — J309 Allergic rhinitis, unspecified: Secondary | ICD-10-CM | POA: Diagnosis not present

## 2022-08-30 ENCOUNTER — Emergency Department (HOSPITAL_BASED_OUTPATIENT_CLINIC_OR_DEPARTMENT_OTHER)
Admission: EM | Admit: 2022-08-30 | Discharge: 2022-08-30 | Disposition: A | Discharge status: Home / Self Care | Payer: No Typology Code available for payment source | Attending: Emergency Medicine | Admitting: Emergency Medicine

## 2022-08-30 ENCOUNTER — Other Ambulatory Visit: Payer: Self-pay

## 2022-08-30 ENCOUNTER — Encounter (HOSPITAL_BASED_OUTPATIENT_CLINIC_OR_DEPARTMENT_OTHER): Payer: Self-pay | Admitting: Emergency Medicine

## 2022-08-30 ENCOUNTER — Emergency Department (HOSPITAL_BASED_OUTPATIENT_CLINIC_OR_DEPARTMENT_OTHER): Payer: No Typology Code available for payment source

## 2022-08-30 DIAGNOSIS — M545 Low back pain, unspecified: Secondary | ICD-10-CM | POA: Insufficient documentation

## 2022-08-30 MED ORDER — OXYCODONE-ACETAMINOPHEN 10-325 MG PO TABS: 1.0000 | ORAL_TABLET | Freq: Four times a day (QID) | ORAL | 0 refills | Status: DC | PRN

## 2022-08-30 MED ORDER — NAPROXEN 375 MG PO TABS: ORAL_TABLET | ORAL | 0 refills | Status: DC

## 2022-08-30 MED ORDER — NAPROXEN 250 MG PO TABS
500.0000 mg | ORAL_TABLET | Freq: Once | ORAL | Status: AC
  Administered 2022-08-30: 500 mg via ORAL
  Filled 2022-08-30: qty 2

## 2022-08-30 NOTE — ED Provider Notes (Signed)
[facility] Provider Note: Georgena Spurling, MD, FACEP  CSN: [id number] MRN: [id number] ARRIVAL: 08/30/22 at [place]: [place]  Back Pain   HISTORY OF PRESENT ILLNESS  08/30/22 1:32 AM Brandon Martinez is a 62 y.o. male with bilateral lower back pain that started 1 week ago.  The pain worsened yesterday evening and he now rates it as a 10 out of 10.  It is preventing him from sleeping despite taking over-the-counter medications.  Pain is worse with movement.  There is some radiation around to the right buttock as well as down the left thigh.  These radiations are intermittent however.  He is having no change in bowel or bladder function.  He is having no saddle anesthesia.  He is able to ambulate.   Past Medical History:  Diagnosis Date   Kidney stone     Past Surgical History:  Procedure Laterality Date   KNEE ARTHROSCOPY     SHOULDER ARTHROSCOPY WITH ROTATOR CUFF REPAIR Bilateral    VASECTOMY      Family History  Problem Relation Age of Onset   Allergic rhinitis Neg Hx    Angioedema Neg Hx    Asthma Neg Hx    Atopy Neg Hx    Eczema Neg Hx    Urticaria Neg Hx    Immunodeficiency Neg Hx     Social History   Tobacco Use   Smoking status: Never    Passive exposure: Never   Smokeless tobacco: Never  Vaping Use   Vaping Use: Never used  Substance Use Topics   Alcohol use: Yes   Drug use: No    Prior to Admission medications   Medication Sig Start Date End Date Taking? Authorizing Provider  naproxen (NAPROSYN) 375 MG tablet Take 1 tablet twice daily for back pain. 08/30/22  Yes Issam Carlyon, MD  oxyCODONE-acetaminophen (PERCOCET) 10-325 MG tablet Take 1 tablet by mouth every 6 (six) hours as needed for pain. 08/30/22  Yes Kjerstin Abrigo, MD  albuterol (VENTOLIN HFA) 108 (90 Base) MCG/ACT inhaler Inhale into the lungs.    [provider]  allopurinol (ZYLOPRIM) 100 MG tablet Take 100 mg by mouth every other day.     [provider]  amLODipine-olmesartan (AZOR) 5-20 MG per tablet Take 1 tablet by mouth daily.    [provider]  atorvastatin (LIPITOR) 20 MG tablet Take 20 mg by mouth every other day.    [provider]  azelastine (ASTELIN) 0.1 % nasal spray Place 1 spray into both nostrils 2 (two) times daily. Use in each nostril as directed 02/04/22   Roney Marion, MD  cetirizine (ZYRTEC) 10 MG tablet Take 1 tablet (10 mg total) by mouth daily. 02/04/22   Roney Marion, MD  cyanocobalamin (VITAMIN B12) 1000 MCG tablet Take 1,000 mcg by mouth daily. 01/28/22   [provider]  EPINEPHrine 0.3 mg/0.3 mL IJ SOAJ injection Inject 0.3 mg into the muscle as needed for anaphylaxis. 03/12/22   Roney Marion, MD  famotidine (PEPCID) 40 MG tablet Take 40 mg by mouth daily. 12/27/21   [provider]  fluticasone (FLONASE) 50 MCG/ACT nasal spray Place into the nose. 12/08/18   [provider]  fluticasone (FLONASE) 50 MCG/ACT nasal spray Place 1 spray into both nostrils daily. 02/04/22   Roney Marion, MD  Fluticasone-Umeclidin-Vilant (TRELEGY ELLIPTA) 200-62.5-25 MCG/ACT AEPB Inhale 1 puff into the lungs daily. 02/04/22   Roney Marion, MD  montelukast (SINGULAIR) 10 MG tablet  09/10/21   [provider]    Allergies Iodine and Shellfish allergy   REVIEW OF SYSTEMS  Negative except as noted here or in the History of Present Illness.   PHYSICAL EXAMINATION  Initial Vital Signs Blood pressure (!) 178/94, pulse (!) 102, temperature 98.4 F (36.9 C), temperature source Oral, resp. rate 18, height '5\' 11"'$  (1.803 m), weight 97.5 kg, SpO2 97 %.  Examination General: Well-developed, well-nourished male in no acute distress; appearance consistent with age of record HENT: normocephalic; atraumatic Eyes: pupils equal, round and reactive to light; extraocular muscles intact Neck: supple Heart: regular rate and rhythm Lungs: clear to auscultation  bilaterally Abdomen: soft; nondistended; nontender; bowel sounds present Back: Right sciatic notch tenderness; pain on movement of lower back Extremities: No deformity; full range of motion; pulses normal Neurologic: Awake, alert and oriented; motor function intact in all extremities and symmetric; no facial droop Skin: Warm and dry Psychiatric: Normal mood and affect   RESULTS  Summary of this visit's results, reviewed and interpreted by myself:   EKG Interpretation  Date/Time:    Ventricular Rate:    PR Interval:    QRS Duration:   QT Interval:    QTC Calculation:   R Axis:     Text Interpretation:         Laboratory Studies: No results found for this or any previous visit (from the past 24 hour(s)). Imaging Studies: DG Lumbar Spine Complete  Result Date: 08/30/2022 CLINICAL DATA:  Low back pain. EXAM: LUMBAR SPINE - COMPLETE 4+ VIEW COMPARISON:  09/03/2013. FINDINGS: There is no evidence of lumbar spine fracture. Alignment is normal. Multilevel intervertebral disc space narrowing, degenerative endplate changes, and facet arthropathy. There is atherosclerotic calcification of the aorta. Calcifications are present over the renal shadows bilaterally measuring up to 5 mm on the left. A linear radiopaque density is noted in the pelvis measuring 1.1 cm. IMPRESSION: 1. Multilevel degenerative disc disease and facet arthropathy. 2. Bilateral nephrolithiasis. 3. Linear radiopaque density projecting over the pelvis in the midline on AP view, which may be external to the patient. Clinical correlation is recommended. Electronically Signed   By: Brett Fairy M.D.   On: 08/30/2022 02:12    ED COURSE and MDM  Nursing notes, initial and subsequent vitals signs, including pulse oximetry, reviewed and interpreted by myself.  Vitals:   08/30/22 0123 08/30/22 0124  BP:  (!) 178/94  Pulse:  (!) 102  Resp:  18  Temp:  98.4 F (36.9 C)  TempSrc:  Oral  SpO2:  97%  Weight: 97.5 kg   Height:  '5\' 11"'$  (1.803 m)    Medications  naproxen (NAPROSYN) tablet 500 mg (has no administration in time range)    No evidence of bony fracture or metastatic disease on plain films.  We will treat the patient's pain and refer to neurosurgery for further evaluation if symptoms persist.  PROCEDURES  Procedures   ED DIAGNOSES     ICD-10-CM   1. Acute midline low back pain without sciatica  M54.50          Juwann Sherk, Jenny Reichmann, MD 08/30/22 [phone]

## 2022-08-30 NOTE — ED Triage Notes (Signed)
Bilateral lower Back pain starting Friday 2/23. States radiates around to sides.  worse tonight while at dinner. Tried OTC meds with no relief. Unable to lay down to sleep.

## 2022-09-04 ENCOUNTER — Ambulatory Visit (INDEPENDENT_AMBULATORY_CARE_PROVIDER_SITE_OTHER): Payer: No Typology Code available for payment source

## 2022-09-04 DIAGNOSIS — J309 Allergic rhinitis, unspecified: Secondary | ICD-10-CM

## 2022-09-11 ENCOUNTER — Ambulatory Visit (INDEPENDENT_AMBULATORY_CARE_PROVIDER_SITE_OTHER): Payer: No Typology Code available for payment source

## 2022-09-11 DIAGNOSIS — J309 Allergic rhinitis, unspecified: Secondary | ICD-10-CM

## 2022-09-18 ENCOUNTER — Ambulatory Visit (INDEPENDENT_AMBULATORY_CARE_PROVIDER_SITE_OTHER): Payer: No Typology Code available for payment source

## 2022-09-18 DIAGNOSIS — J309 Allergic rhinitis, unspecified: Secondary | ICD-10-CM | POA: Diagnosis not present

## 2022-09-25 ENCOUNTER — Ambulatory Visit (INDEPENDENT_AMBULATORY_CARE_PROVIDER_SITE_OTHER): Payer: No Typology Code available for payment source

## 2022-09-25 DIAGNOSIS — J309 Allergic rhinitis, unspecified: Secondary | ICD-10-CM

## 2022-10-02 ENCOUNTER — Ambulatory Visit (INDEPENDENT_AMBULATORY_CARE_PROVIDER_SITE_OTHER): Payer: No Typology Code available for payment source

## 2022-10-02 DIAGNOSIS — J309 Allergic rhinitis, unspecified: Secondary | ICD-10-CM | POA: Diagnosis not present

## 2022-10-09 ENCOUNTER — Ambulatory Visit (INDEPENDENT_AMBULATORY_CARE_PROVIDER_SITE_OTHER): Payer: No Typology Code available for payment source | Admitting: *Deleted

## 2022-10-09 DIAGNOSIS — J309 Allergic rhinitis, unspecified: Secondary | ICD-10-CM

## 2022-10-16 ENCOUNTER — Ambulatory Visit (INDEPENDENT_AMBULATORY_CARE_PROVIDER_SITE_OTHER): Payer: No Typology Code available for payment source

## 2022-10-16 DIAGNOSIS — J309 Allergic rhinitis, unspecified: Secondary | ICD-10-CM

## 2022-10-23 ENCOUNTER — Ambulatory Visit (INDEPENDENT_AMBULATORY_CARE_PROVIDER_SITE_OTHER): Payer: No Typology Code available for payment source

## 2022-10-23 DIAGNOSIS — J309 Allergic rhinitis, unspecified: Secondary | ICD-10-CM

## 2022-10-29 ENCOUNTER — Other Ambulatory Visit: Payer: Self-pay | Admitting: Internal Medicine

## 2022-10-30 ENCOUNTER — Ambulatory Visit (INDEPENDENT_AMBULATORY_CARE_PROVIDER_SITE_OTHER): Payer: No Typology Code available for payment source

## 2022-10-30 DIAGNOSIS — J309 Allergic rhinitis, unspecified: Secondary | ICD-10-CM

## 2022-11-06 ENCOUNTER — Ambulatory Visit (INDEPENDENT_AMBULATORY_CARE_PROVIDER_SITE_OTHER): Payer: No Typology Code available for payment source

## 2022-11-06 DIAGNOSIS — J309 Allergic rhinitis, unspecified: Secondary | ICD-10-CM

## 2022-11-13 ENCOUNTER — Ambulatory Visit (INDEPENDENT_AMBULATORY_CARE_PROVIDER_SITE_OTHER): Payer: No Typology Code available for payment source

## 2022-11-13 DIAGNOSIS — J309 Allergic rhinitis, unspecified: Secondary | ICD-10-CM

## 2022-11-20 ENCOUNTER — Ambulatory Visit (INDEPENDENT_AMBULATORY_CARE_PROVIDER_SITE_OTHER): Payer: No Typology Code available for payment source

## 2022-11-20 DIAGNOSIS — J309 Allergic rhinitis, unspecified: Secondary | ICD-10-CM

## 2022-11-27 ENCOUNTER — Ambulatory Visit (INDEPENDENT_AMBULATORY_CARE_PROVIDER_SITE_OTHER): Payer: No Typology Code available for payment source

## 2022-11-27 DIAGNOSIS — J309 Allergic rhinitis, unspecified: Secondary | ICD-10-CM | POA: Diagnosis not present

## 2022-11-28 DIAGNOSIS — J3081 Allergic rhinitis due to animal (cat) (dog) hair and dander: Secondary | ICD-10-CM | POA: Diagnosis not present

## 2022-11-28 NOTE — Progress Notes (Signed)
VIALS EXP 11-28-23 

## 2022-11-29 DIAGNOSIS — J3089 Other allergic rhinitis: Secondary | ICD-10-CM

## 2022-12-04 ENCOUNTER — Ambulatory Visit (INDEPENDENT_AMBULATORY_CARE_PROVIDER_SITE_OTHER): Payer: No Typology Code available for payment source

## 2022-12-04 DIAGNOSIS — J309 Allergic rhinitis, unspecified: Secondary | ICD-10-CM

## 2022-12-11 ENCOUNTER — Ambulatory Visit (INDEPENDENT_AMBULATORY_CARE_PROVIDER_SITE_OTHER): Payer: No Typology Code available for payment source

## 2022-12-11 DIAGNOSIS — J309 Allergic rhinitis, unspecified: Secondary | ICD-10-CM

## 2022-12-18 ENCOUNTER — Ambulatory Visit (INDEPENDENT_AMBULATORY_CARE_PROVIDER_SITE_OTHER): Payer: No Typology Code available for payment source

## 2022-12-18 DIAGNOSIS — J309 Allergic rhinitis, unspecified: Secondary | ICD-10-CM | POA: Diagnosis not present

## 2022-12-25 ENCOUNTER — Ambulatory Visit (INDEPENDENT_AMBULATORY_CARE_PROVIDER_SITE_OTHER): Payer: No Typology Code available for payment source

## 2022-12-25 DIAGNOSIS — J309 Allergic rhinitis, unspecified: Secondary | ICD-10-CM

## 2023-01-01 ENCOUNTER — Ambulatory Visit (INDEPENDENT_AMBULATORY_CARE_PROVIDER_SITE_OTHER): Payer: No Typology Code available for payment source

## 2023-01-01 DIAGNOSIS — J309 Allergic rhinitis, unspecified: Secondary | ICD-10-CM

## 2023-01-09 ENCOUNTER — Ambulatory Visit (INDEPENDENT_AMBULATORY_CARE_PROVIDER_SITE_OTHER): Payer: No Typology Code available for payment source

## 2023-01-09 DIAGNOSIS — J309 Allergic rhinitis, unspecified: Secondary | ICD-10-CM | POA: Diagnosis not present

## 2023-01-15 ENCOUNTER — Ambulatory Visit (INDEPENDENT_AMBULATORY_CARE_PROVIDER_SITE_OTHER): Payer: No Typology Code available for payment source

## 2023-01-15 DIAGNOSIS — J309 Allergic rhinitis, unspecified: Secondary | ICD-10-CM

## 2023-01-22 ENCOUNTER — Ambulatory Visit (INDEPENDENT_AMBULATORY_CARE_PROVIDER_SITE_OTHER): Payer: No Typology Code available for payment source

## 2023-01-22 DIAGNOSIS — J309 Allergic rhinitis, unspecified: Secondary | ICD-10-CM

## 2023-01-29 ENCOUNTER — Ambulatory Visit (INDEPENDENT_AMBULATORY_CARE_PROVIDER_SITE_OTHER): Payer: No Typology Code available for payment source

## 2023-01-29 DIAGNOSIS — J309 Allergic rhinitis, unspecified: Secondary | ICD-10-CM | POA: Diagnosis not present

## 2023-02-05 ENCOUNTER — Ambulatory Visit (INDEPENDENT_AMBULATORY_CARE_PROVIDER_SITE_OTHER): Payer: No Typology Code available for payment source

## 2023-02-05 DIAGNOSIS — J309 Allergic rhinitis, unspecified: Secondary | ICD-10-CM

## 2023-02-10 DIAGNOSIS — J3081 Allergic rhinitis due to animal (cat) (dog) hair and dander: Secondary | ICD-10-CM

## 2023-02-10 NOTE — Progress Notes (Signed)
Vials Exp 02/10/24

## 2023-02-11 DIAGNOSIS — J3089 Other allergic rhinitis: Secondary | ICD-10-CM

## 2023-02-17 ENCOUNTER — Ambulatory Visit (INDEPENDENT_AMBULATORY_CARE_PROVIDER_SITE_OTHER): Payer: No Typology Code available for payment source

## 2023-02-17 DIAGNOSIS — J309 Allergic rhinitis, unspecified: Secondary | ICD-10-CM

## 2023-02-25 ENCOUNTER — Ambulatory Visit (INDEPENDENT_AMBULATORY_CARE_PROVIDER_SITE_OTHER): Payer: No Typology Code available for payment source

## 2023-02-25 DIAGNOSIS — J309 Allergic rhinitis, unspecified: Secondary | ICD-10-CM

## 2023-03-10 ENCOUNTER — Ambulatory Visit (INDEPENDENT_AMBULATORY_CARE_PROVIDER_SITE_OTHER): Payer: No Typology Code available for payment source

## 2023-03-10 DIAGNOSIS — J309 Allergic rhinitis, unspecified: Secondary | ICD-10-CM | POA: Diagnosis not present

## 2023-03-19 ENCOUNTER — Ambulatory Visit (INDEPENDENT_AMBULATORY_CARE_PROVIDER_SITE_OTHER): Payer: No Typology Code available for payment source

## 2023-03-19 DIAGNOSIS — J309 Allergic rhinitis, unspecified: Secondary | ICD-10-CM

## 2023-03-26 ENCOUNTER — Ambulatory Visit (INDEPENDENT_AMBULATORY_CARE_PROVIDER_SITE_OTHER): Payer: No Typology Code available for payment source

## 2023-03-26 DIAGNOSIS — J309 Allergic rhinitis, unspecified: Secondary | ICD-10-CM | POA: Diagnosis not present

## 2023-04-01 ENCOUNTER — Ambulatory Visit (INDEPENDENT_AMBULATORY_CARE_PROVIDER_SITE_OTHER): Payer: No Typology Code available for payment source

## 2023-04-01 DIAGNOSIS — J309 Allergic rhinitis, unspecified: Secondary | ICD-10-CM

## 2023-04-16 ENCOUNTER — Ambulatory Visit (INDEPENDENT_AMBULATORY_CARE_PROVIDER_SITE_OTHER): Payer: No Typology Code available for payment source

## 2023-04-16 DIAGNOSIS — J309 Allergic rhinitis, unspecified: Secondary | ICD-10-CM | POA: Diagnosis not present

## 2023-04-28 ENCOUNTER — Other Ambulatory Visit: Payer: Self-pay | Admitting: Internal Medicine

## 2023-04-30 ENCOUNTER — Ambulatory Visit (INDEPENDENT_AMBULATORY_CARE_PROVIDER_SITE_OTHER): Payer: No Typology Code available for payment source

## 2023-04-30 DIAGNOSIS — J309 Allergic rhinitis, unspecified: Secondary | ICD-10-CM | POA: Diagnosis not present

## 2023-05-14 ENCOUNTER — Ambulatory Visit (INDEPENDENT_AMBULATORY_CARE_PROVIDER_SITE_OTHER): Payer: No Typology Code available for payment source

## 2023-05-14 DIAGNOSIS — J309 Allergic rhinitis, unspecified: Secondary | ICD-10-CM

## 2023-05-14 NOTE — Progress Notes (Signed)
VIALS EXP 05-13-24

## 2023-05-15 DIAGNOSIS — J3081 Allergic rhinitis due to animal (cat) (dog) hair and dander: Secondary | ICD-10-CM | POA: Diagnosis not present

## 2023-05-16 DIAGNOSIS — J3089 Other allergic rhinitis: Secondary | ICD-10-CM | POA: Diagnosis not present

## 2023-05-28 ENCOUNTER — Ambulatory Visit (INDEPENDENT_AMBULATORY_CARE_PROVIDER_SITE_OTHER): Payer: No Typology Code available for payment source

## 2023-05-28 ENCOUNTER — Ambulatory Visit: Payer: No Typology Code available for payment source | Admitting: Internal Medicine

## 2023-05-28 DIAGNOSIS — J309 Allergic rhinitis, unspecified: Secondary | ICD-10-CM

## 2023-06-11 ENCOUNTER — Ambulatory Visit (INDEPENDENT_AMBULATORY_CARE_PROVIDER_SITE_OTHER): Payer: No Typology Code available for payment source | Admitting: Internal Medicine

## 2023-06-11 ENCOUNTER — Other Ambulatory Visit: Payer: Self-pay

## 2023-06-11 ENCOUNTER — Ambulatory Visit: Payer: Self-pay

## 2023-06-11 ENCOUNTER — Encounter: Payer: Self-pay | Admitting: Internal Medicine

## 2023-06-11 VITALS — BP 134/86 | HR 73 | Temp 98.3°F | Resp 20 | Ht 72.0 in | Wt 216.9 lb

## 2023-06-11 DIAGNOSIS — J309 Allergic rhinitis, unspecified: Secondary | ICD-10-CM

## 2023-06-11 DIAGNOSIS — J4540 Moderate persistent asthma, uncomplicated: Secondary | ICD-10-CM | POA: Diagnosis not present

## 2023-06-11 DIAGNOSIS — J3089 Other allergic rhinitis: Secondary | ICD-10-CM

## 2023-06-11 NOTE — Progress Notes (Signed)
Follow Up Note  RE: Brandon Martinez MRN: [id number] DOB: [birth year] Date of Office Visit: 06/11/2023  Referring provider: Raynelle Jan., MD Primary care provider: Raynelle Jan., MD  Chief Complaint: Follow-up (Yearly visit doing well on injections)  History of Present Illness: I had the pleasure of seeing Brandon Martinez for a follow up visit at the [facility] on 06/11/2023. He is a 62 y.o. male, who is being followed for chronic cough, persistent asthma, allergic rhinitis. His previous allergy office visit was on 03/13/23 with Dr. Marlynn Perking. Today is a regular follow up visit.  History obtained from patient, chart review.   [facility] - Medical therapy: Trelegy 1 puff daily, Montelukast 10mg  daily  - Rescue inhaler use: rare use  - Symptoms: cough with yardwork and after rain  - Exacerbation history: 0  ABX for respiratory illness since last visit, 0 OCS, 0ED, 0 UC visits in the past year  - ACT: 25 /25 - Adverse effects of medication: denies  - Previous FEV1: 2.39 L, 73% predicted - Biologic Labs: on fasenra through the [place]   Allergic Rhinitis: current therapy: Montelukast 10 mg daily, Zyrtec 10 mg daily, Flonase nasal spray, Astelin nasal spray,  symptoms improved significantly since starting AIT symptoms include:  Rare nasal congestion Previous allergy testing:  02/04/22: SPT: positive to grass, weed, mold, roach; intradermal was positive to ragweed, mold mix 1, mold mix 2, mold mix 3, mold mix 4, dog, dust mite History of reflux/heartburn: no Allergy Immunotherapy: yes: started 03/12/22: Vial 1 (M-CR-DM), vial 2 (G-W-T-D)  No large local or systemic reactions   Assessment and Plan: [place] is a 62 y.o. male with: Other allergic rhinitis  Moderate persistent asthma without complication Plan: Patient Instructions  Allergic rhinitis: Significantly improved - testing 02/05/22:  Epicutaneous testing positive to grass, weed, mold, roach;  intradermal testing was positive to ragweed, mold mix 1, mold mix 2, mold mix 3, mold mix 4, dog, dust mite - Continue avoidance measures  - Continue with:  Zyrtec 10 mg daily as needed, Astelin 1 spray per nostril twice daily as needed -Continue allergy injections per protocol and carry EpiPen on injection day  Moderate Persitent  Asthma:  Improved  - Breathing test today showed: Stable -Continue Fasenra injections through the [place]  PLAN:  - Spacer use reviewed. - Daily controller medication(s):  Trelegy 1 puff daily   (this replaces the advair)  - Prior to physical activity: albuterol 2 puffs 10-15 minutes before physical activity. - Rescue medications: albuterol 4 puffs every 4-6 hours as needed - Asthma control goals:  * Full participation in all desired activities (may need albuterol before activity) * Albuterol use two time or less a week on average (not counting use with activity) * Cough interfering with sleep two time or less a month * Oral steroids no more than once a year * No hospitalizations  Follow up:  12 months   Thank you so much for letting me partake in your care today.  Don't hesitate to reach out if you have any additional concerns!  Ferol Luz, MD  [facility] [place] [place]  No follow-ups on file.  No orders of the defined types were placed in this encounter.   Lab Orders  No laboratory test(s) ordered today   Diagnostics: Spirometry:  Tracings reviewed. His effort: Good reproducible efforts. FVC: 3.05 L FEV1: 2.39 L, 73% predicted FEV1/FVC ratio: 78% Interpretation: Spirometry consistent  with possible restrictive disease.  After 4 puffs of albuterol FEV1 increased by 1% and 20 cc, FVC did not change at all.  This is not a significant postbronchodilator response Please see scanned spirometry results for details.  Results interpreted by myself during this encounter and discussed with patient/family.   Medication List:   Current Outpatient Medications  Medication Sig Dispense Refill   albuterol (VENTOLIN HFA) 108 (90 Base) MCG/ACT inhaler Inhale into the lungs.     amLODipine-olmesartan (AZOR) 5-20 MG per tablet Take 1 tablet by mouth daily.     atorvastatin (LIPITOR) 20 MG tablet Take 20 mg by mouth every other day.     azelastine (ASTELIN) 0.1 % nasal spray Place 1 spray into both nostrils 2 (two) times daily. Use in each nostril as directed 30 mL 6   cetirizine (ZYRTEC) 10 MG tablet TAKE 1 TABLET(10 MG) BY MOUTH DAILY 90 tablet 1   cyanocobalamin (VITAMIN B12) 1000 MCG tablet Take 1,000 mcg by mouth daily.     EPINEPHrine 0.3 mg/0.3 mL IJ SOAJ injection Inject 0.3 mg into the muscle as needed for anaphylaxis. 1 each 1   famotidine (PEPCID) 40 MG tablet Take 40 mg by mouth daily.     fluticasone (FLONASE) 50 MCG/ACT nasal spray Place into the nose.     fluticasone (FLONASE) 50 MCG/ACT nasal spray Place 1 spray into both nostrils daily. 16 g 2   montelukast (SINGULAIR) 10 MG tablet      naproxen (NAPROSYN) 375 MG tablet Take 1 tablet twice daily for back pain. 20 tablet 0   allopurinol (ZYLOPRIM) 100 MG tablet Take 100 mg by mouth every other day.      Fluticasone-Umeclidin-Vilant (TRELEGY ELLIPTA) 200-62.5-25 MCG/ACT AEPB Inhale 1 puff into the lungs daily. 1 each 6   oxyCODONE-acetaminophen (PERCOCET) 10-325 MG tablet Take 1 tablet by mouth every 6 (six) hours as needed for pain. 20 tablet 0   No current facility-administered medications for this visit.   Allergies: Allergies  Allergen Reactions   Iodine Swelling   Shellfish Allergy Swelling   I reviewed his past medical history, social history, family history, and environmental history and no significant changes have been reported from his previous visit.  ROS: All others negative except as noted per HPI.   Objective: BP 134/86 (BP Location: Left Arm, Patient Position: Sitting, Cuff Size: Normal)   Pulse 73   Temp 98.3 F (36.8 C) (Temporal)    Resp 20   Ht 6' (1.829 m)   Wt 216 lb 14.4 oz (98.4 kg)   SpO2 98%   BMI 29.42 kg/m  Body mass index is 29.42 kg/m. General Appearance:  Alert, cooperative, no distress, appears stated age  Head:  Normocephalic, without obvious abnormality, atraumatic  Eyes:  Conjunctiva clear, EOM's intact  Nose: Nares normal, hypertrophic turbinates, no visible anterior polyps, and septum midline  Throat: Lips, tongue normal; teeth and gums normal, normal posterior oropharynx and no tonsillar exudate  Neck: Supple, symmetrical  Lungs:   clear to auscultation bilaterally, Respirations unlabored, no coughing  Heart:  regular rate and rhythm and no murmur, Appears well perfused  Extremities: No edema  Skin: Skin color, texture, turgor normal, no rashes or lesions on visualized portions of skin   Neurologic: No gross deficits   Previous notes and tests were reviewed. The plan was reviewed with the patient/family, and all questions/concerned were addressed.  It was my pleasure to see Tarance today and participate in his care. Please feel free to  contact me with any questions or concerns.  Sincerely,  Ferol Luz, MD  Allergy & Immunology  [facility] Office: [phone]

## 2023-06-11 NOTE — Patient Instructions (Addendum)
Allergic rhinitis: Significantly improved - testing 02/05/22:  Epicutaneous testing positive to grass, weed, mold, roach; intradermal testing was positive to ragweed, mold mix 1, mold mix 2, mold mix 3, mold mix 4, dog, dust mite - Continue avoidance measures  - Continue with:  Zyrtec 10 mg daily as needed, Astelin 1 spray per nostril twice daily as needed -Continue allergy injections per protocol and carry EpiPen on injection day  Moderate Persitent  Asthma:  Improved  - Breathing test today showed: Stable -Continue Fasenra injections through the [place]  PLAN:  - Spacer use reviewed. - Daily controller medication(s):  Trelegy 1 puff daily   (this replaces the advair)  - Prior to physical activity: albuterol 2 puffs 10-15 minutes before physical activity. - Rescue medications: albuterol 4 puffs every 4-6 hours as needed - Asthma control goals:  * Full participation in all desired activities (may need albuterol before activity) * Albuterol use two time or less a week on average (not counting use with activity) * Cough interfering with sleep two time or less a month * Oral steroids no more than once a year * No hospitalizations  Follow up:  12 months   Thank you so much for letting me partake in your care today.  Don't hesitate to reach out if you have any additional concerns!  Ferol Luz, MD  [facility] [place] [place]

## 2023-06-24 ENCOUNTER — Ambulatory Visit (INDEPENDENT_AMBULATORY_CARE_PROVIDER_SITE_OTHER): Payer: No Typology Code available for payment source

## 2023-06-24 DIAGNOSIS — J309 Allergic rhinitis, unspecified: Secondary | ICD-10-CM

## 2023-07-01 ENCOUNTER — Ambulatory Visit (INDEPENDENT_AMBULATORY_CARE_PROVIDER_SITE_OTHER): Payer: No Typology Code available for payment source

## 2023-07-01 DIAGNOSIS — J309 Allergic rhinitis, unspecified: Secondary | ICD-10-CM | POA: Diagnosis not present

## 2023-07-08 ENCOUNTER — Ambulatory Visit (INDEPENDENT_AMBULATORY_CARE_PROVIDER_SITE_OTHER): Payer: No Typology Code available for payment source

## 2023-07-08 DIAGNOSIS — J309 Allergic rhinitis, unspecified: Secondary | ICD-10-CM

## 2023-07-17 ENCOUNTER — Other Ambulatory Visit: Payer: Self-pay

## 2023-07-17 ENCOUNTER — Emergency Department (HOSPITAL_BASED_OUTPATIENT_CLINIC_OR_DEPARTMENT_OTHER)
Admission: EM | Admit: 2023-07-17 | Discharge: 2023-07-18 | Disposition: A | Discharge status: Home / Self Care | Payer: No Typology Code available for payment source | Attending: Emergency Medicine | Admitting: Emergency Medicine

## 2023-07-17 ENCOUNTER — Emergency Department (HOSPITAL_BASED_OUTPATIENT_CLINIC_OR_DEPARTMENT_OTHER): Payer: No Typology Code available for payment source

## 2023-07-17 DIAGNOSIS — R7989 Other specified abnormal findings of blood chemistry: Secondary | ICD-10-CM | POA: Insufficient documentation

## 2023-07-17 DIAGNOSIS — R1032 Left lower quadrant pain: Secondary | ICD-10-CM | POA: Diagnosis present

## 2023-07-17 DIAGNOSIS — N23 Unspecified renal colic: Secondary | ICD-10-CM | POA: Insufficient documentation

## 2023-07-17 LAB — COMPREHENSIVE METABOLIC PANEL
ALT: 21 U/L (ref 0–44)
AST: 29 U/L (ref 15–41)
Albumin: 4.4 g/dL (ref 3.5–5.0)
Alkaline Phosphatase: 67 U/L (ref 38–126)
Anion gap: 9 (ref 5–15)
BUN: 18 mg/dL (ref 6–20)
CO2: 26 mmol/L (ref 22–32)
Calcium: 9.6 mg/dL (ref 8.9–10.3)
Chloride: 102 mmol/L (ref 98–111)
Creatinine, Ser: 1.72 mg/dL — ABNORMAL HIGH (ref 0.61–1.24)
GFR, Estimated: 45 mL/min — ABNORMAL LOW (ref >60)
Glucose, Bld: 124 mg/dL — ABNORMAL HIGH (ref 70–99)
Potassium: 3.9 mmol/L (ref 3.5–5.1)
Sodium: 137 mmol/L (ref 135–145)
Total Bilirubin: 0.9 mg/dL (ref 0.0–1.2)
Total Protein: 7.9 g/dL (ref 6.5–8.1)

## 2023-07-17 LAB — CBC
HCT: 46.9 % (ref 39.0–52.0)
Hemoglobin: 15.7 g/dL (ref 13.0–17.0)
MCH: 29.7 pg (ref 26.0–34.0)
MCHC: 33.5 g/dL (ref 30.0–36.0)
MCV: 88.7 fL (ref 80.0–100.0)
Platelets: 217 10*3/uL (ref 150–400)
RBC: 5.29 MIL/uL (ref 4.22–5.81)
RDW: 15 % (ref 11.5–15.5)
WBC: 5.4 10*3/uL (ref 4.0–10.5)
nRBC: 0 % (ref 0.0–0.2)

## 2023-07-17 LAB — URINALYSIS, MICROSCOPIC (REFLEX): WBC, UA: NONE SEEN WBC/hpf (ref 0–5)

## 2023-07-17 LAB — URINALYSIS, ROUTINE W REFLEX MICROSCOPIC
Bilirubin Urine: NEGATIVE
Glucose, UA: NEGATIVE mg/dL
Ketones, ur: NEGATIVE mg/dL
Leukocytes,Ua: NEGATIVE
Nitrite: NEGATIVE
Protein, ur: NEGATIVE mg/dL
Specific Gravity, Urine: 1.02 (ref 1.005–1.030)
pH: 6.5 (ref 5.0–8.0)

## 2023-07-17 LAB — LIPASE, BLOOD: Lipase: 24 U/L (ref 11–51)

## 2023-07-17 MED ORDER — KETOROLAC TROMETHAMINE 15 MG/ML IJ SOLN
15.0000 mg | Freq: Once | INTRAMUSCULAR | Status: AC
  Administered 2023-07-18: 15 mg via INTRAVENOUS
  Filled 2023-07-17: qty 1

## 2023-07-17 MED ORDER — ONDANSETRON 4 MG PO TBDP
4.0000 mg | ORAL_TABLET | Freq: Once | ORAL | Status: DC | PRN
  Filled 2023-07-17: qty 1

## 2023-07-17 MED ORDER — TAMSULOSIN HCL 0.4 MG PO CAPS: 0.4000 mg | ORAL_CAPSULE | Freq: Every day | ORAL | 0 refills | Status: AC

## 2023-07-17 MED ORDER — OXYCODONE HCL 5 MG PO TABS: 5.0000 mg | ORAL_TABLET | Freq: Four times a day (QID) | ORAL | 0 refills | Status: AC | PRN

## 2023-07-17 MED ORDER — ONDANSETRON 4 MG PO TBDP: 4.0000 mg | ORAL_TABLET | Freq: Three times a day (TID) | ORAL | 0 refills | Status: AC | PRN

## 2023-07-17 MED ORDER — FENTANYL CITRATE PF 50 MCG/ML IJ SOSY
50.0000 ug | PREFILLED_SYRINGE | Freq: Once | INTRAMUSCULAR | Status: AC
  Administered 2023-07-17: 50 ug via INTRAVENOUS
  Filled 2023-07-17: qty 1

## 2023-07-17 MED ORDER — SODIUM CHLORIDE 0.9 % IV BOLUS
1000.0000 mL | Freq: Once | INTRAVENOUS | Status: AC
Start: 2023-07-17 — End: 2023-07-18
  Administered 2023-07-17: 1000 mL via INTRAVENOUS

## 2023-07-17 NOTE — ED Triage Notes (Addendum)
PtPOV c/o L side pain radiating to stomach x3 days.  C/o nausea, diarrhea.  Denies emesis. Reports hx of kidney stones, says this feels different

## 2023-07-17 NOTE — ED Provider Notes (Signed)
[facility] Provider Note   CSN: [id number] Arrival date & time: 07/17/23  1914     History  Chief Complaint  Patient presents with   Abdominal Pain    Brandon Martinez is a 63 y.o. male.  Patient with history of kidney stone, no surgical history presents to the emergency department today for evaluation of abdominal pain.  Symptoms started 3 days ago.  Pain is left-sided and radiates to the right side.  He has had associated nausea without vomiting.  Also has had some diarrhea, no constipation.  No blood in the stool.  No urinary symptoms.  Symptoms feel different than his previous kidney stone.        Home Medications Prior to Admission medications   Medication Sig Start Date End Date Taking? Authorizing Provider  albuterol (VENTOLIN HFA) 108 (90 Base) MCG/ACT inhaler Inhale into the lungs.    [provider]  amLODipine-olmesartan (AZOR) 5-20 MG per tablet Take 1 tablet by mouth daily.    [provider]  atorvastatin (LIPITOR) 20 MG tablet Take 20 mg by mouth every other day.    [provider]  azelastine (ASTELIN) 0.1 % nasal spray Place 1 spray into both nostrils 2 (two) times daily. Use in each nostril as directed 02/04/22   Ferol Luz, MD  cetirizine (ZYRTEC) 10 MG tablet TAKE 1 TABLET(10 MG) BY MOUTH DAILY 04/28/23   Ferol Luz, MD  cyanocobalamin (VITAMIN B12) 1000 MCG tablet Take 1,000 mcg by mouth daily. 01/28/22   [provider]  EPINEPHrine 0.3 mg/0.3 mL IJ SOAJ injection Inject 0.3 mg into the muscle as needed for anaphylaxis. 03/12/22   Ferol Luz, MD  famotidine (PEPCID) 40 MG tablet Take 40 mg by mouth daily. 12/27/21   [provider]  fluticasone (FLONASE) 50 MCG/ACT nasal spray Place into the nose. 12/08/18   [provider]  fluticasone (FLONASE) 50 MCG/ACT nasal spray Place 1 spray into both nostrils daily. 02/04/22   Ferol Luz, MD   Fluticasone-Umeclidin-Vilant (TRELEGY ELLIPTA) 200-62.5-25 MCG/ACT AEPB Inhale 1 puff into the lungs daily. 02/04/22   Ferol Luz, MD  naproxen (NAPROSYN) 375 MG tablet Take 1 tablet twice daily for back pain. 08/30/22   Molpus, John, MD      Allergies    Iodine and Shellfish allergy    Review of Systems   Review of Systems  Physical Exam Updated Vital Signs BP (!) 163/98 (BP Location: Right Arm)   Pulse 89   Temp 97.8 F (36.6 C) (Oral)   Resp 16   Ht 5\' 11"  (1.803 m)   Wt 96.2 kg   SpO2 96%   BMI 29.57 kg/m   Physical Exam Vitals and nursing note reviewed.  Constitutional:      General: He is not in acute distress.    Appearance: He is well-developed.  HENT:     Head: Normocephalic and atraumatic.  Eyes:     General:        Right eye: No discharge.        Left eye: No discharge.     Conjunctiva/sclera: Conjunctivae normal.  Cardiovascular:     Rate and Rhythm: Normal rate and regular rhythm.     Heart sounds: Normal heart sounds.  Pulmonary:     Effort: Pulmonary effort is normal.     Breath sounds: Normal breath sounds.  Abdominal:     Palpations: Abdomen is soft.     Tenderness: There is abdominal tenderness  in the periumbilical area, suprapubic area and left lower quadrant. There is no guarding or rebound. Negative signs include Murphy's sign and McBurney's sign.  Musculoskeletal:     Cervical back: Normal range of motion and neck supple.  Skin:    General: Skin is warm and dry.  Neurological:     Mental Status: He is alert.    ED Results / Procedures / Treatments   Labs (all labs ordered are listed, but only abnormal results are displayed) Labs Reviewed  COMPREHENSIVE METABOLIC PANEL - Abnormal; Notable for the following components:      Result Value   Glucose, Bld 124 (*)    Creatinine, Ser 1.72 (*)    GFR, Estimated 45 (*)    All other components within normal limits  URINALYSIS, ROUTINE W REFLEX MICROSCOPIC - Abnormal; Notable for the  following components:   Hgb urine dipstick TRACE (*)    All other components within normal limits  URINALYSIS, MICROSCOPIC (REFLEX) - Abnormal; Notable for the following components:   Bacteria, UA RARE (*)    All other components within normal limits  LIPASE, BLOOD  CBC    EKG None  Radiology CT ABDOMEN PELVIS WO CONTRAST Result Date: 07/17/2023 CLINICAL DATA:  Left abdominal pain x3 days, nausea, diarrhea EXAM: CT ABDOMEN AND PELVIS WITHOUT CONTRAST TECHNIQUE: Multidetector CT imaging of the abdomen and pelvis was performed following the standard protocol without IV contrast. RADIATION DOSE REDUCTION: This exam was performed according to the departmental dose-optimization program which includes automated exposure control, adjustment of the mA and/or kV according to patient size and/or use of iterative reconstruction technique. COMPARISON:  09/03/2013 FINDINGS: Lower chest: Lung bases are essentially clear, noting mild bibasilar atelectasis. Hepatobiliary: Unenhanced liver is unremarkable. Gallbladder is unremarkable. No intrahepatic or extrahepatic ductal dilatation. Pancreas: Within normal limits. Spleen: Within normal limits. Adrenals/Urinary Tract: Adrenal glands are within normal limits. Two nonobstructing right lower pole renal calculi measuring up to 5 mm (series 301/image 27). One nonobstructing interpolar left renal calculus measuring 4 mm (series 301/image 23). Mild left hydronephrosis with associated 7 mm proximal left ureteral calculus at the L3-4 level (coronal image 87). Bladder is within normal limits. Stomach/Bowel: Stomach is within normal limits. No evidence of bowel obstruction. Normal appendix (series 301/image 58). No colonic wall thickening or inflammatory changes. Vascular/Lymphatic: No evidence of abdominal aortic aneurysm. Atherosclerotic calcifications of the abdominal aorta and branch vessels. No suspicious abdominopelvic lymphadenopathy. Reproductive: Prostate is  unremarkable. Other: No abdominopelvic ascites. Trace fat in the left inguinal canal. Musculoskeletal: Mild degenerative changes of the visualized thoracolumbar spine. IMPRESSION: 7 mm proximal left ureteral calculus. Associated mild left hydronephrosis. Additional bilateral nonobstructing renal calculi, as above. Electronically Signed   By: Charline Bills M.D.   On: 07/17/2023 23:43    Procedures Procedures    Medications Ordered in ED Medications  ondansetron (ZOFRAN-ODT) disintegrating tablet 4 mg (has no administration in time range)  ketorolac (TORADOL) 15 MG/ML injection 15 mg (has no administration in time range)  sodium chloride 0.9 % bolus 1,000 mL (1,000 mLs Intravenous New Bag/Given 07/17/23 2301)  fentaNYL (SUBLIMAZE) injection 50 mcg (50 mcg Intravenous Given 07/17/23 2301)    ED Course/ Medical Decision Making/ A&P    Patient seen and examined. History obtained directly from patient. Work-up including labs, imaging, EKG ordered in triage, if performed, were reviewed.  Reviewed lab workup and care everywhere from 2022/2023.  Labs/EKG: Independently reviewed and interpreted.  This included: CBC unremarkable; CMP shows elevated creatinine, glucose 124; lipase  normal.  UA without signs of infection.  Imaging: CT abdomen pelvis with contrast  Medications/Fluids: Ordered: IV fluid bolus, IV fentanyl for pain.  Most recent vital signs reviewed and are as follows: BP (!) 163/98 (BP Location: Right Arm)   Pulse 89   Temp 97.8 F (36.6 C) (Oral)   Resp 16   Ht 5\' 11"  (1.803 m)   Wt 96.2 kg   SpO2 96%   BMI 29.57 kg/m   Initial impression: Abdominal pain  12:05 AM Reassessment performed. Patient appears stable, comfortable.  Symptoms currently controlled.  Imaging personally visualized and interpreted including: CT abdomen pelvis, agree 7 mm left-sided ureteral stone, no other emergent problems.  Reviewed pertinent lab work and imaging with patient at bedside.  Questions answered.  We discussed elevated creatinine.  This appears acute.  Treated with IV fluids, encouraged maintaining good hydration at home.  Discussed he will need to have this checked either by his primary care or with urology in 1 week to ensure that this returns to baseline.  Most current vital signs reviewed and are as follows: BP (!) 155/93   Pulse 76   Temp 97.8 F (36.6 C) (Oral)   Resp 15   Ht 5\' 11"  (1.803 m)   Wt 96.2 kg   SpO2 97%   BMI 29.57 kg/m   Plan: Discharge to home.  Will give 15 mg IV Toradol prior to discharge.  Prescriptions written for: Oxycodone, Zofran, Flomax  Patient counseled on kidney stone treatment. Urged patient to strain urine and save any stones. Urged urology follow-up and return to [facility] with any complications. Counseled patient to maintain good fluid intake.   Counseled patient on use of Flomax.   Patient counseled on use of narcotic pain medications. Counseled not to combine these medications with others containing tylenol. Urged not to drink alcohol, drive, or perform any other activities that requires focus while taking these medications. The patient verbalizes understanding and agrees with the plan.  The patient was urged to return to the Emergency Department immediately with worsening of current symptoms, worsening abdominal pain, persistent vomiting, blood noted in stools, fever, or any other concerns. The patient verbalized understanding.                                   Medical Decision Making Amount and/or Complexity of Data Reviewed Labs: ordered. Radiology: ordered.  Risk Prescription drug management.   For this patient's complaint of abdominal pain, the following conditions were considered on the differential diagnosis: gastritis/PUD, enteritis/duodenitis, appendicitis, cholelithiasis/cholecystitis, cholangitis, pancreatitis, ruptured viscus, colitis, diverticulitis, small/large bowel obstruction, proctitis, cystitis,  pyelonephritis, ureteral colic, aortic dissection, aortic aneurysm. Atypical chest etiologies were also considered including ACS, PE, and pneumonia.  CT shows left ureteral stone is likely cause of the pain.  This will require urology follow-up.  Patient states that he has an appointment next month.  I encouraged him to call sooner for an appointment.  Patient also made aware of elevated creatinine, need for continued hydration.  This will need to be rechecked.  Do not feel that he requires admission for acute kidney injury today as he is drinking well.  The patient's vital signs, pertinent lab work and imaging were reviewed and interpreted as discussed in the ED course. Hospitalization was considered for further testing, treatments, or serial exams/observation. However as patient is well-appearing, has a stable exam, and reassuring studies today, I do not feel  that they warrant admission at this time. This plan was discussed with the patient who verbalizes agreement and comfort with this plan and seems reliable and able to return to the Emergency Department with worsening or changing symptoms.             Final Clinical Impression(s) / ED Diagnoses Final diagnoses:  Ureteral colic  Elevated serum creatinine    Rx / DC Orders ED Discharge Orders          Ordered    oxyCODONE (OXY IR/ROXICODONE) 5 MG immediate release tablet  Every 6 hours PRN        07/17/23 2359    ondansetron (ZOFRAN-ODT) 4 MG disintegrating tablet  Every 8 hours PRN        07/17/23 2359    tamsulosin (FLOMAX) 0.4 MG CAPS capsule  Daily        07/17/23 2359              Renne Crigler, PA-C 07/18/23 0010    Maia Plan, MD 07/18/23 0246

## 2023-07-18 NOTE — ED Notes (Signed)
Patient didn't have a ride home (after having fentanyl) so he will stay for another 30 mins before leaving.

## 2023-07-18 NOTE — Discharge Instructions (Signed)
Please read and follow all provided instructions.  Your diagnoses today include:  1. Ureteral colic   2. Elevated serum creatinine     Tests performed today include: Urine test that showed no infection CT scan which showed a 7 millimeter kidney stone on the left side Blood test that showed elevated creatinine/weak kidney function --this will need to be rechecked by your doctor or urologist in about 1 week Vital signs. See below for your results today.   Medications prescribed:  Oxycodone - narcotic pain medication  DO NOT drive or perform any activities that require you to be awake and alert because this medicine can make you drowsy.   Zofran (ondansetron) - for nausea and vomiting  Flomax (tamsulosin) - relaxes smooth muscle to help kidney stones pass  Take any prescribed medications only as directed.  Home care instructions:  Follow any educational materials contained in this packet.  Please double your fluid intake for the next several days. Strain your urine and save any stones that may pass.   BE VERY CAREFUL not to take multiple medicines containing Tylenol (also called acetaminophen). Doing so can lead to an overdose which can damage your liver and cause liver failure and possibly death.   Follow-up instructions: Please follow-up with your urologist or the urologist referral (provided on front page) in the next 1 week for further evaluation of your symptoms.  Return instructions:  If you need to return to the Emergency Department, go to [facility] and not [facility]. The urologists are located at [facility] and can better care for you at this location.  Please return to the Emergency Department if you experience worsening symptoms.  Please return if you develop fever or uncontrolled pain or vomiting. Please return if you have any other emergent concerns.  Additional Information:  Your vital signs today were: BP (!) 155/93   Pulse 76   Temp 97.8  F (36.6 C) (Oral)   Resp 15   Ht 5\' 11"  (1.803 m)   Wt 96.2 kg   SpO2 97%   BMI 29.57 kg/m  If your blood pressure (BP) was elevated above 135/85 this visit, please have this repeated by your doctor within one month. --------------

## 2023-07-20 ENCOUNTER — Emergency Department (HOSPITAL_COMMUNITY)
Admission: EM | Admit: 2023-07-20 | Discharge: 2023-07-20 | Disposition: A | Discharge status: Home / Self Care | Payer: No Typology Code available for payment source | Attending: Emergency Medicine | Admitting: Emergency Medicine

## 2023-07-20 ENCOUNTER — Emergency Department (HOSPITAL_COMMUNITY): Payer: No Typology Code available for payment source

## 2023-07-20 DIAGNOSIS — R109 Unspecified abdominal pain: Secondary | ICD-10-CM | POA: Diagnosis present

## 2023-07-20 DIAGNOSIS — N200 Calculus of kidney: Secondary | ICD-10-CM

## 2023-07-20 DIAGNOSIS — N132 Hydronephrosis with renal and ureteral calculous obstruction: Secondary | ICD-10-CM | POA: Insufficient documentation

## 2023-07-20 LAB — CBC WITH DIFFERENTIAL/PLATELET
Abs Immature Granulocytes: 0 10*3/uL (ref 0.00–0.07)
Basophils Absolute: 0 10*3/uL (ref 0.0–0.1)
Basophils Relative: 0 %
Eosinophils Absolute: 0 10*3/uL (ref 0.0–0.5)
Eosinophils Relative: 0 %
HCT: 46.7 % (ref 39.0–52.0)
Hemoglobin: 15.7 g/dL (ref 13.0–17.0)
Immature Granulocytes: 0 %
Lymphocytes Relative: 40 %
Lymphs Abs: 1.8 10*3/uL (ref 0.7–4.0)
MCH: 30.2 pg (ref 26.0–34.0)
MCHC: 33.6 g/dL (ref 30.0–36.0)
MCV: 89.8 fL (ref 80.0–100.0)
Monocytes Absolute: 0.5 10*3/uL (ref 0.1–1.0)
Monocytes Relative: 11 %
Neutro Abs: 2.2 10*3/uL (ref 1.7–7.7)
Neutrophils Relative %: 49 %
Platelets: 231 10*3/uL (ref 150–400)
RBC: 5.2 MIL/uL (ref 4.22–5.81)
RDW: 14.7 % (ref 11.5–15.5)
WBC: 4.5 10*3/uL (ref 4.0–10.5)
nRBC: 0 % (ref 0.0–0.2)

## 2023-07-20 LAB — BASIC METABOLIC PANEL
Anion gap: 8 (ref 5–15)
BUN: 14 mg/dL (ref 6–20)
CO2: 25 mmol/L (ref 22–32)
Calcium: 9.9 mg/dL (ref 8.9–10.3)
Chloride: 106 mmol/L (ref 98–111)
Creatinine, Ser: 1.05 mg/dL (ref 0.61–1.24)
GFR, Estimated: >60 mL/min (ref >60)
Glucose, Bld: 103 mg/dL — ABNORMAL HIGH (ref 70–99)
Potassium: 4 mmol/L (ref 3.5–5.1)
Sodium: 139 mmol/L (ref 135–145)

## 2023-07-20 MED ORDER — HYDROMORPHONE HCL 1 MG/ML IJ SOLN
0.5000 mg | Freq: Once | INTRAMUSCULAR | Status: AC
  Administered 2023-07-20: 0.5 mg via INTRAVENOUS
  Filled 2023-07-20: qty 1

## 2023-07-20 MED ORDER — ONDANSETRON HCL 4 MG/2ML IJ SOLN
4.0000 mg | Freq: Once | INTRAMUSCULAR | Status: AC
  Administered 2023-07-20: 4 mg via INTRAVENOUS
  Filled 2023-07-20: qty 2

## 2023-07-20 MED ORDER — KETOROLAC TROMETHAMINE 30 MG/ML IJ SOLN
30.0000 mg | Freq: Once | INTRAMUSCULAR | Status: AC
  Administered 2023-07-20: 30 mg via INTRAVENOUS
  Filled 2023-07-20: qty 1

## 2023-07-20 MED ORDER — OXYCODONE-ACETAMINOPHEN 5-325 MG PO TABS: 1.0000 | ORAL_TABLET | Freq: Four times a day (QID) | ORAL | 0 refills | Status: DC | PRN

## 2023-07-20 NOTE — ED Provider Notes (Signed)
[place] EMERGENCY DEPARTMENT AT [facility] Provider Note   CSN: [id number] Arrival date & time: 07/20/23  2057     History {Add pertinent medical, surgical, social history, OB history to HPI:1} Chief Complaint  Patient presents with   Flank Pain    Brandon Martinez is a 63 y.o. male.  Patient complains of right flank pain.  He is running out of his Percocet.  He has a right ureteral stone   Flank Pain       Home Medications Prior to Admission medications   Medication Sig Start Date End Date Taking? Authorizing Provider  allopurinol (ZYLOPRIM) 300 MG tablet Take 300 mg by mouth daily.   Yes [provider]  meloxicam (MOBIC) 7.5 MG tablet Take 7.5 mg by mouth daily as needed for pain.   Yes [provider]  montelukast (SINGULAIR) 10 MG tablet Take 10 mg by mouth every morning.   Yes [provider]  oxyCODONE-acetaminophen (PERCOCET) 5-325 MG tablet Take 1 tablet by mouth every 6 (six) hours as needed. 07/20/23  Yes Bethann Berkshire, MD  albuterol (VENTOLIN HFA) 108 (90 Base) MCG/ACT inhaler Inhale 2 puffs into the lungs every 6 (six) hours as needed.   Yes [provider]  amLODipine (NORVASC) 10 MG tablet Take 10 mg by mouth daily.   Yes [provider]  atorvastatin (LIPITOR) 20 MG tablet Take 20 mg by mouth daily.   Yes [provider]  azelastine (ASTELIN) 0.1 % nasal spray Place 1 spray into both nostrils 2 (two) times daily. Use in each nostril as directed 02/04/22  Yes Ferol Luz, MD  cetirizine (ZYRTEC) 10 MG tablet TAKE 1 TABLET(10 MG) BY MOUTH DAILY Patient taking differently: Take 10 mg by mouth daily as needed for allergies. 04/28/23  Yes Ferol Luz, MD  cyanocobalamin (VITAMIN B12) 1000 MCG tablet Take 1,000 mcg by mouth daily. 01/28/22  Yes [provider]  EPINEPHrine 0.3 mg/0.3 mL IJ SOAJ injection Inject 0.3 mg into the muscle as needed for anaphylaxis. 03/12/22  Yes Ferol Luz, MD  fluticasone (FLONASE) 50 MCG/ACT nasal spray Place 1 spray into both nostrils daily. 02/04/22  Yes Ferol Luz, MD  Fluticasone-Umeclidin-Vilant (TRELEGY ELLIPTA) 200-62.5-25 MCG/ACT AEPB Inhale 1 puff into the lungs daily. 02/04/22  Yes Ferol Luz, MD  ondansetron (ZOFRAN-ODT) 4 MG disintegrating tablet Take 1 tablet (4 mg total) by mouth every 8 (eight) hours as needed for nausea or vomiting. 07/17/23  Yes Renne Crigler, PA-C  oxyCODONE (OXY IR/ROXICODONE) 5 MG immediate release tablet Take 1 tablet (5 mg total) by mouth every 6 (six) hours as needed for severe pain (pain score 7-10). 07/17/23  Yes Renne Crigler, PA-C  tamsulosin (FLOMAX) 0.4 MG CAPS capsule Take 1 capsule (0.4 mg total) by mouth daily. 07/17/23  Yes Renne Crigler, PA-C      Allergies    Iodine and Shellfish allergy    Review of Systems   Review of Systems  Genitourinary:  Positive for flank pain.    Physical Exam Updated Vital Signs There were no vitals taken for this visit. Physical Exam  ED Results / Procedures / Treatments   Labs (all labs ordered are listed, but only abnormal results are displayed) Labs Reviewed  BASIC METABOLIC PANEL - Abnormal; Notable for the following components:      Result Value   Glucose, Bld 103 (*)    All other components within normal limits  CBC WITH DIFFERENTIAL/PLATELET    EKG None  Radiology CT  Renal Stone Study Result Date: 07/20/2023 CLINICAL DATA:  Abdominal/flank pain, stone suspected EXAM: CT ABDOMEN AND PELVIS WITHOUT CONTRAST TECHNIQUE: Multidetector CT imaging of the abdomen and pelvis was performed following the standard protocol without IV contrast. RADIATION DOSE REDUCTION: This exam was performed according to the departmental dose-optimization program which includes automated exposure control, adjustment of the mA and/or kV according to patient size and/or use of iterative reconstruction technique. COMPARISON:  07/17/2023 FINDINGS: Lower chest:  No acute abnormality. Right coronary artery and aortic calcifications. Hepatobiliary: No focal hepatic abnormality. Gallbladder unremarkable. Pancreas: No focal abnormality or ductal dilatation. Spleen: Normal size. Calcification and small cyst in the superior aspect, unchanged. Adrenals/Urinary Tract: Adrenal glands normal. Bilateral nonobstructing renal stones. Mild left hydronephrosis due to 5 mm proximal to mid left ureteral stone, unchanged since prior study. Urinary bladder decompressed, unremarkable. Stomach/Bowel: Normal appendix. Stomach, large and small bowel grossly unremarkable. Vascular/Lymphatic: Aortic atherosclerosis. No evidence of aneurysm or adenopathy. Reproductive: Prominent prostate. Other: No free fluid or free air. Musculoskeletal: No acute bony abnormality. Degenerative changes in the thoracolumbar spine. IMPRESSION: 5 mm proximal to mid left ureteral stone with mild left hydronephrosis, stable since prior study. Bilateral nephrolithiasis. Coronary artery disease, aortic atherosclerosis. Electronically Signed   By: Charlett Nose M.D.   On: 07/20/2023 22:30    Procedures Procedures  {Document cardiac monitor, telemetry assessment procedure when appropriate:1}  Medications Ordered in ED Medications  ketorolac (TORADOL) 30 MG/ML injection 30 mg (30 mg Intravenous Given 07/20/23 2152)  ondansetron (ZOFRAN) injection 4 mg (4 mg Intravenous Given 07/20/23 2152)  HYDROmorphone (DILAUDID) injection 0.5 mg (0.5 mg Intravenous Given 07/20/23 2152)    ED Course/ Medical Decision Making/ A&P   {   Click here for ABCD2, HEART and other calculatorsREFRESH Note before signing :1}                              Medical Decision Making Amount and/or Complexity of Data Reviewed Labs: ordered. Radiology: ordered.  Risk Prescription drug management.   Patient with right ureteral stone.  He is given Percocets and they will follow-up with PCP  {Document critical care time when  appropriate:1} {Document review of labs and clinical decision tools ie heart score, Chads2Vasc2 etc:1}  {Document your independent review of radiology images, and any outside records:1} {Document your discussion with family members, caretakers, and with consultants:1} {Document social determinants of health affecting pt's care:1} {Document your decision making why or why not admission, treatments were needed:1} Final Clinical Impression(s) / ED Diagnoses Final diagnoses:  Kidney stone    Rx / DC Orders ED Discharge Orders          Ordered    oxyCODONE-acetaminophen (PERCOCET) 5-325 MG tablet  Every 6 hours PRN        07/20/23 2327

## 2023-07-20 NOTE — ED Triage Notes (Signed)
Patient arrived stating he has a 7mm kidney stone and unable to get to the urologist until 1/31. Complaints of nausea and pain.

## 2023-07-20 NOTE — Discharge Instructions (Signed)
Follow-up with a urologist in the next week.  Return if any problems

## 2023-07-21 ENCOUNTER — Other Ambulatory Visit: Payer: Self-pay | Admitting: Urology

## 2023-07-23 ENCOUNTER — Encounter (HOSPITAL_BASED_OUTPATIENT_CLINIC_OR_DEPARTMENT_OTHER): Payer: Self-pay | Admitting: Urology

## 2023-07-23 ENCOUNTER — Telehealth: Payer: Self-pay

## 2023-07-23 NOTE — Progress Notes (Signed)
Spoke w/ via phone for pre-op interview--- [employer]----              n/a COVID test -----patient states asymptomatic no test needed Arrive at ------- 0645 NPO after MN NO Solid Food.  Clear liquids from MN until--- sip of water with meds only Med rec completed. Pt aware to hold ASA/NSAIDs and supplements per [facility] protocol.  Medications to take morning of surgery ----- amlodipine, atorvastatin, montelukast, oxy prn Diabetic/Weight loss medication ----- n/a No Alcohol or recreational drugs for 24 hours/Tobacco products for 6 hours ---- pt aware Patient instructed to bring blue lithotripsy folder, photo id and insurance card day of surgery. Patient aware to have Driver (ride ) / caregiver for 24 hours after surgery ----- Lorenso Courier (spouse) Patient Special Instructions ----- bring albuterol inhaler Pre-Op special Instructions ----- take laxative of choice day before procedure Patient verbalized understanding of instructions that were given at this phone interview. Patient denies shortness of breath, chest pain, fever, cough at this phone interview.

## 2023-07-23 NOTE — H&P (Signed)
63 year old male last seen by [facility] in 2014 for incontinence, nephrolithiasis and BPH. Most recently patient was seen in the ER on 07/20/2023 for left mid ureteral stone 4.9 mm in diameter creatinine 1.5, no signs of infection UA normal. CT on 1/16 to 1/19 shows minimal movement of kidney stone.   PMH: HTN and HLD, no MI or other cards hx, no blood thinners, hx of asthma  PSH: shoulder and knee surgery   Nephrolithiasis:  07/21/23: no F/C, had some nausea, no emesis. currently on oxycodone. Flomax, zofran, oxycodone, patient's pain is currently managed appropriately. 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 His stone is visualized on the scout film of CT on the left side.     ALLERGIES: Contrast Dye Iodine SOLN Shellfish    MEDICATIONS: Allopurinol TABS Oral  Atorvastatin Calcium TABS Oral  Azor 5 mg-20 mg tablet Oral  Ondansetron Odt 4 mg tablet,disintegrating Oral  Oxycodone-Acetaminophen 5 mg-325 mg tablet Oral  Tamsulosin Hcl 0.4 mg capsule Oral     GU PSH: None     PSH Notes: Knee Arthroscopy   NON-GU PSH: None   GU PMH: Ureteral calculus, Calculus of ureter - 2015 BPH w/o LUTS, Benign prostatic hypertrophy without lower urinary tract symptoms - 2014 ED due to arterial insufficiency, Erectile dysfunction due to arterial insufficiency - 2014 Encounter for Prostate Cancer screening, Prostate cancer screening - 2014 History of urolithiasis, Nephrolithiasis - 2014 Renal calculus, Nephrolithiasis - 2014    NON-GU PMH: Encounter for general adult medical examination without abnormal findings, Encounter for preventive health examination - 2015 Personal history of other diseases of the circulatory system, History of hypertension - 2014 Personal history of other endocrine, nutritional and metabolic disease, History of hypercholesterolemia - 2014    FAMILY HISTORY: Acute Myocardial Infarction - Father Death In The  Family Father - Runs In Family Death In The Family Mother - Runs In Family Family Health Status Number - Runs In Family Lung Cancer - Runs In Family   SOCIAL HISTORY: None    Notes: Marital History - Currently Married, Occupation:, Alcohol Use, Tobacco Use, Caffeine Use, Never A Smoker   REVIEW OF SYSTEMS:    GU Review Male:   Patient denies trouble starting your stream, frequent urination, stream starts and stops, get up at night to urinate, leakage of urine, penile pain, have to strain to urinate , burning/ pain with urination, erection problems, and hard to postpone urination.  Gastrointestinal (Upper):   Patient denies nausea, vomiting, and indigestion/ heartburn.  Gastrointestinal (Lower):   Patient denies diarrhea and constipation.  Constitutional:   Patient denies fever, night sweats, weight loss, and fatigue.  Skin:   Patient denies skin rash/ lesion and itching.  Eyes:   Patient denies blurred vision and double vision.  Ears/ Nose/ Throat:   Patient denies sore throat and sinus problems.  Hematologic/Lymphatic:   Patient denies swollen glands and easy bruising.  Cardiovascular:   Patient denies leg swelling and chest pains.  Respiratory:   Patient denies cough and shortness of breath.  Endocrine:   Patient denies excessive thirst.  Musculoskeletal:   Patient denies back pain and joint pain.  Neurological:   Patient denies headaches and dizziness.  Psychologic:   Patient denies depression and anxiety.   VITAL SIGNS:      07/21/2023 01:03 PM  BP 154/80 mmHg  Pulse 80 /min  Temperature 98.0 F / 36.6 C   MULTI-SYSTEM PHYSICAL  EXAMINATION:    Constitutional: Well-nourished. No physical deformities. Normally developed. Good grooming.  Respiratory: No labored breathing, no use of accessory muscles.   Cardiovascular: Normal temperature, normal extremity pulses, no swelling, no varicosities.  Neurologic / Psychiatric: Oriented to time, oriented to place, oriented to person. No  depression, no anxiety, no agitation.  Musculoskeletal: Normal gait and station of head and neck.     Complexity of Data:  Lab Test Review:   BMP  Records Review:   Previous Hospital Records  Urine Test Review:   Urinalysis  X-Ray Review: C.T. Abdomen/Pelvis: Reviewed Films. Reviewed Report. 4 mm left-sided stone mid ureter seen on scout film. Left-sided hydronephrosis noted    04/09/13  PSA  Total PSA 0.64     04/10/13  Hormones  Testosterone, Total 336    Notes:                     OSH creatinine: 1.05   PROCEDURES:          Visit Complexity - G2211          Urinalysis w/Scope Dipstick Dipstick Cont'd Micro  Color: Yellow Bilirubin: Neg mg/dL WBC/hpf: 0 - 5/hpf  Appearance: Clear Ketones: Neg mg/dL RBC/hpf: 20 - 30/QMV  Specific Gravity: 1.025 Blood: 3+ ery/uL Bacteria: NS (Not Seen)  pH: 6.0 Protein: Trace mg/dL Cystals: Triple Phos  Glucose: Neg mg/dL Urobilinogen: 0.2 mg/dL Casts: NS (Not Seen)    Nitrites: Neg Trichomonas: Not Present    Leukocyte Esterase: Neg leu/uL Mucous: Present      Epithelial Cells: NS (Not Seen)      Yeast: NS (Not Seen)      Sperm: Not Present    ASSESSMENT:      ICD-10 Details  1 GU:   Ureteral calculus - N20.1    PLAN:            Medications New Meds: Flomax 0.4 mg capsule 1 capsule PO Q HS   #30  0 Refill(s)  Ketorolac Tromethamine 10 mg tablet 1 tablet PO Q 8 H   #14  0 Refill(s)  Methocarbamol 750 mg tablet 2 tablet PO Q 6 H   #40  0 Refill(s)  Pharmacy Name:  [id number]  Address:  [address]   [place], [place] [id number]  Phone:  [phone]  Fax:  [phone]            Orders Labs Urine Culture          Document Letter(s):  Created for Patient: Clinical Summary         Notes:   Left ureteral calculus: We discussed options including trial of passage, ESWL, stent placement and ureteroscopy patient preferred to avoid ureteroscopy 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 Will plan for ESWL scheduled for Friday, 07/25/2023 we discussed risk events alternatives to procedure including bleeding infections failure to pass all of stone needing further intervention in the future, risk of arrhythmia. Patient voices understanding would like to continue will schedule for surgery. Urine culture ordered today.   Stone is easily visualized on scout film from CT no need for KUB today.

## 2023-07-23 NOTE — Telephone Encounter (Signed)
Patients authorization expires on 08/02/2023. I have sent a new auth request to [facility] for review. I will update the patient via MyChart.

## 2023-07-25 ENCOUNTER — Other Ambulatory Visit: Payer: Self-pay

## 2023-07-25 ENCOUNTER — Ambulatory Visit (HOSPITAL_BASED_OUTPATIENT_CLINIC_OR_DEPARTMENT_OTHER)
Admission: RE | Admit: 2023-07-25 | Discharge: 2023-07-25 | Disposition: A | Discharge status: Home / Self Care | Payer: 59 | Attending: Urology | Admitting: Urology

## 2023-07-25 ENCOUNTER — Ambulatory Visit (HOSPITAL_COMMUNITY): Payer: 59

## 2023-07-25 ENCOUNTER — Encounter (HOSPITAL_BASED_OUTPATIENT_CLINIC_OR_DEPARTMENT_OTHER)
Admission: RE | Disposition: A | Discharge status: Home / Self Care | Payer: Self-pay | Source: Home / Self Care | Attending: Urology

## 2023-07-25 ENCOUNTER — Encounter (HOSPITAL_BASED_OUTPATIENT_CLINIC_OR_DEPARTMENT_OTHER): Payer: Self-pay | Admitting: Urology

## 2023-07-25 DIAGNOSIS — N202 Calculus of kidney with calculus of ureter: Secondary | ICD-10-CM | POA: Insufficient documentation

## 2023-07-25 HISTORY — DX: Essential (primary) hypertension: I10

## 2023-07-25 HISTORY — PX: EXTRACORPOREAL SHOCK WAVE LITHOTRIPSY: SHX1557

## 2023-07-25 HISTORY — DX: Allergy, unspecified, initial encounter: T78.40XA

## 2023-07-25 HISTORY — DX: Pure hypercholesterolemia, unspecified: E78.00

## 2023-07-25 HISTORY — DX: Unspecified asthma, uncomplicated: J45.909

## 2023-07-25 SURGERY — LITHOTRIPSY, ESWL
Anesthesia: LOCAL | Laterality: Left

## 2023-07-25 MED ORDER — SODIUM CHLORIDE 0.9% FLUSH: 3.0000 mL | INTRAVENOUS | Status: DC | PRN

## 2023-07-25 MED ORDER — SODIUM CHLORIDE 0.9 % IV SOLN: INTRAVENOUS | Status: DC

## 2023-07-25 MED ORDER — OXYCODONE HCL 5 MG PO TABS
5.0000 mg | ORAL_TABLET | ORAL | Status: DC | PRN
Start: 2023-07-25 — End: 2023-07-25

## 2023-07-25 MED ORDER — ACETAMINOPHEN 325 MG PO TABS
650.0000 mg | ORAL_TABLET | ORAL | Status: DC | PRN
Start: 2023-07-25 — End: 2023-07-25

## 2023-07-25 MED ORDER — OXYCODONE-ACETAMINOPHEN 5-325 MG PO TABS: 1.0000 | ORAL_TABLET | Freq: Four times a day (QID) | ORAL | 0 refills | Status: AC | PRN

## 2023-07-25 MED ORDER — SODIUM CHLORIDE 0.9% FLUSH: 3.0000 mL | Freq: Two times a day (BID) | INTRAVENOUS | Status: DC

## 2023-07-25 MED ORDER — SODIUM CHLORIDE 0.9 % IV SOLN: 250.0000 mL | INTRAVENOUS | Status: DC | PRN

## 2023-07-25 MED ORDER — MORPHINE SULFATE (PF) 4 MG/ML IV SOLN: 2.0000 mg | INTRAVENOUS | Status: DC | PRN

## 2023-07-25 MED ORDER — ACETAMINOPHEN 325 MG RE SUPP
650.0000 mg | RECTAL | Status: DC | PRN
Start: 2023-07-25 — End: 2023-07-25

## 2023-07-25 MED ORDER — DIAZEPAM 5 MG PO TABS
10.0000 mg | ORAL_TABLET | ORAL | Status: AC
  Administered 2023-07-25: 10 mg via ORAL

## 2023-07-25 MED ORDER — CIPROFLOXACIN HCL 500 MG PO TABS
ORAL_TABLET | ORAL | Status: AC
  Filled 2023-07-25: qty 1

## 2023-07-25 MED ORDER — DIAZEPAM 5 MG PO TABS
ORAL_TABLET | ORAL | Status: AC
  Filled 2023-07-25: qty 2

## 2023-07-25 MED ORDER — DIPHENHYDRAMINE HCL 25 MG PO CAPS
ORAL_CAPSULE | ORAL | Status: AC
  Filled 2023-07-25: qty 1

## 2023-07-25 MED ORDER — DIPHENHYDRAMINE HCL 25 MG PO CAPS
25.0000 mg | ORAL_CAPSULE | ORAL | Status: AC
  Administered 2023-07-25: 25 mg via ORAL

## 2023-07-25 MED ORDER — CIPROFLOXACIN HCL 500 MG PO TABS
500.0000 mg | ORAL_TABLET | ORAL | Status: AC
  Administered 2023-07-25: 500 mg via ORAL

## 2023-07-25 NOTE — Op Note (Signed)
See [facility] op note

## 2023-07-25 NOTE — Interval H&P Note (Signed)
History and Physical Interval Note: No change in stone location.  07/25/2023 8:25 AM  Brandon Martinez  has presented today for surgery, with the diagnosis of LEFT URETERAL STONE.  The various methods of treatment have been discussed with the patient and family. After consideration of risks, benefits and other options for treatment, the patient has consented to  Procedure(s) with comments: LEFT EXTRACORPOREAL SHOCK WAVE LITHOTRIPSY (ESWL) (Left) - 75 MINUTE CASE as a surgical intervention.  The patient's history has been reviewed, patient examined, no change in status, stable for surgery.  I have reviewed the patient's chart and labs.  Questions were answered to the patient's satisfaction.     Bjorn Pippin

## 2023-07-28 ENCOUNTER — Encounter (HOSPITAL_BASED_OUTPATIENT_CLINIC_OR_DEPARTMENT_OTHER): Payer: Self-pay | Admitting: Urology

## 2023-08-06 ENCOUNTER — Ambulatory Visit (INDEPENDENT_AMBULATORY_CARE_PROVIDER_SITE_OTHER): Payer: No Typology Code available for payment source

## 2023-08-06 DIAGNOSIS — J309 Allergic rhinitis, unspecified: Secondary | ICD-10-CM

## 2023-09-03 ENCOUNTER — Ambulatory Visit (INDEPENDENT_AMBULATORY_CARE_PROVIDER_SITE_OTHER)

## 2023-09-03 DIAGNOSIS — J309 Allergic rhinitis, unspecified: Secondary | ICD-10-CM

## 2023-10-01 ENCOUNTER — Ambulatory Visit (INDEPENDENT_AMBULATORY_CARE_PROVIDER_SITE_OTHER)

## 2023-10-01 DIAGNOSIS — J309 Allergic rhinitis, unspecified: Secondary | ICD-10-CM

## 2023-10-23 ENCOUNTER — Other Ambulatory Visit: Payer: Self-pay | Admitting: Internal Medicine

## 2023-11-05 ENCOUNTER — Ambulatory Visit (INDEPENDENT_AMBULATORY_CARE_PROVIDER_SITE_OTHER)

## 2023-11-05 DIAGNOSIS — J309 Allergic rhinitis, unspecified: Secondary | ICD-10-CM

## 2023-11-17 DIAGNOSIS — J3081 Allergic rhinitis due to animal (cat) (dog) hair and dander: Secondary | ICD-10-CM | POA: Diagnosis not present

## 2023-11-17 NOTE — Progress Notes (Signed)
 VIALS MADE 11-17-23

## 2023-11-18 DIAGNOSIS — J3089 Other allergic rhinitis: Secondary | ICD-10-CM | POA: Diagnosis not present

## 2023-12-03 ENCOUNTER — Ambulatory Visit (INDEPENDENT_AMBULATORY_CARE_PROVIDER_SITE_OTHER)

## 2023-12-03 DIAGNOSIS — J309 Allergic rhinitis, unspecified: Secondary | ICD-10-CM

## 2023-12-31 ENCOUNTER — Ambulatory Visit (INDEPENDENT_AMBULATORY_CARE_PROVIDER_SITE_OTHER)

## 2023-12-31 DIAGNOSIS — J309 Allergic rhinitis, unspecified: Secondary | ICD-10-CM

## 2024-01-28 ENCOUNTER — Ambulatory Visit (INDEPENDENT_AMBULATORY_CARE_PROVIDER_SITE_OTHER)

## 2024-01-28 DIAGNOSIS — J309 Allergic rhinitis, unspecified: Secondary | ICD-10-CM

## 2024-02-09 ENCOUNTER — Ambulatory Visit (INDEPENDENT_AMBULATORY_CARE_PROVIDER_SITE_OTHER)

## 2024-02-09 DIAGNOSIS — J309 Allergic rhinitis, unspecified: Secondary | ICD-10-CM

## 2024-02-18 ENCOUNTER — Ambulatory Visit (INDEPENDENT_AMBULATORY_CARE_PROVIDER_SITE_OTHER)

## 2024-02-18 DIAGNOSIS — J309 Allergic rhinitis, unspecified: Secondary | ICD-10-CM

## 2024-03-24 ENCOUNTER — Ambulatory Visit (INDEPENDENT_AMBULATORY_CARE_PROVIDER_SITE_OTHER)

## 2024-03-24 DIAGNOSIS — J309 Allergic rhinitis, unspecified: Secondary | ICD-10-CM

## 2024-04-21 ENCOUNTER — Ambulatory Visit

## 2024-04-21 DIAGNOSIS — J309 Allergic rhinitis, unspecified: Secondary | ICD-10-CM

## 2024-04-24 ENCOUNTER — Other Ambulatory Visit: Payer: Self-pay | Admitting: Internal Medicine

## 2024-05-26 ENCOUNTER — Ambulatory Visit (INDEPENDENT_AMBULATORY_CARE_PROVIDER_SITE_OTHER)

## 2024-05-26 DIAGNOSIS — J309 Allergic rhinitis, unspecified: Secondary | ICD-10-CM

## 2024-06-29 DIAGNOSIS — J301 Allergic rhinitis due to pollen: Secondary | ICD-10-CM | POA: Diagnosis not present

## 2024-06-29 DIAGNOSIS — J3081 Allergic rhinitis due to animal (cat) (dog) hair and dander: Secondary | ICD-10-CM | POA: Diagnosis not present

## 2024-06-29 NOTE — Progress Notes (Signed)
 VIALS MADE ON 06/29/24

## 2024-06-30 DIAGNOSIS — J302 Other seasonal allergic rhinitis: Secondary | ICD-10-CM | POA: Diagnosis not present

## 2024-06-30 DIAGNOSIS — J3089 Other allergic rhinitis: Secondary | ICD-10-CM | POA: Diagnosis not present

## 2024-07-07 ENCOUNTER — Ambulatory Visit

## 2024-07-07 DIAGNOSIS — J309 Allergic rhinitis, unspecified: Secondary | ICD-10-CM

## 2024-07-07 DIAGNOSIS — J302 Other seasonal allergic rhinitis: Secondary | ICD-10-CM

## 2024-08-04 ENCOUNTER — Ambulatory Visit

## 2024-08-04 DIAGNOSIS — J302 Other seasonal allergic rhinitis: Secondary | ICD-10-CM | POA: Diagnosis not present

## 2024-09-01 ENCOUNTER — Ambulatory Visit: Admitting: Internal Medicine
